# Patient Record
Sex: Female | Born: 1940
Health system: Southern US, Community
[De-identification: ages and names within clinical notes are randomized; demographics above are authoritative.]

## PROBLEM LIST (undated history)

## (undated) DIAGNOSIS — M1990 Unspecified osteoarthritis, unspecified site: Secondary | ICD-10-CM

## (undated) DIAGNOSIS — Z860100 Personal history of colon polyps, unspecified: Secondary | ICD-10-CM

## (undated) DIAGNOSIS — I4819 Other persistent atrial fibrillation: Secondary | ICD-10-CM

## (undated) DIAGNOSIS — I5189 Other ill-defined heart diseases: Secondary | ICD-10-CM

## (undated) DIAGNOSIS — I10 Essential (primary) hypertension: Secondary | ICD-10-CM

## (undated) DIAGNOSIS — K5790 Diverticulosis of intestine, part unspecified, without perforation or abscess without bleeding: Secondary | ICD-10-CM

## (undated) DIAGNOSIS — R079 Chest pain, unspecified: Secondary | ICD-10-CM

## (undated) DIAGNOSIS — K219 Gastro-esophageal reflux disease without esophagitis: Secondary | ICD-10-CM

## (undated) DIAGNOSIS — M8580 Other specified disorders of bone density and structure, unspecified site: Secondary | ICD-10-CM

## (undated) HISTORY — DX: Other ill-defined heart diseases: I51.89

## (undated) HISTORY — PX: HERNIA REPAIR: SHX51

## (undated) HISTORY — PX: CATARACT EXTRACTION: SUR2

## (undated) HISTORY — PX: CHOLECYSTECTOMY: SHX55

## (undated) HISTORY — DX: Other persistent atrial fibrillation: I48.19

## (undated) HISTORY — PX: BREAST CYST ASPIRATION: SHX578

## (undated) HISTORY — DX: Chest pain, unspecified: R07.9

---

## 1977-09-18 HISTORY — PX: ABDOMINAL HYSTERECTOMY: SHX81

## 2003-12-23 ENCOUNTER — Other Ambulatory Visit: Payer: Self-pay

## 2004-09-30 ENCOUNTER — Ambulatory Visit: Payer: Self-pay | Admitting: Family Medicine

## 2006-01-18 ENCOUNTER — Ambulatory Visit: Payer: Self-pay | Admitting: Family Medicine

## 2007-04-19 ENCOUNTER — Ambulatory Visit: Payer: Self-pay | Admitting: Family Medicine

## 2008-06-15 ENCOUNTER — Ambulatory Visit: Payer: Self-pay | Admitting: Family Medicine

## 2009-04-07 ENCOUNTER — Inpatient Hospital Stay: Payer: Self-pay | Admitting: Internal Medicine

## 2009-06-16 ENCOUNTER — Ambulatory Visit: Payer: Self-pay | Admitting: Family Medicine

## 2009-06-29 ENCOUNTER — Ambulatory Visit: Payer: Self-pay | Admitting: Family Medicine

## 2010-08-26 ENCOUNTER — Ambulatory Visit: Payer: Self-pay | Admitting: Family Medicine

## 2011-11-07 ENCOUNTER — Ambulatory Visit: Payer: Self-pay | Admitting: Family Medicine

## 2012-12-11 ENCOUNTER — Ambulatory Visit: Payer: Self-pay | Admitting: Family Medicine

## 2013-12-25 ENCOUNTER — Ambulatory Visit: Payer: Self-pay | Admitting: Family Medicine

## 2014-01-02 ENCOUNTER — Ambulatory Visit: Payer: Self-pay | Admitting: Family Medicine

## 2015-07-21 ENCOUNTER — Other Ambulatory Visit: Payer: Self-pay | Admitting: Family Medicine

## 2015-07-21 DIAGNOSIS — Z1231 Encounter for screening mammogram for malignant neoplasm of breast: Secondary | ICD-10-CM

## 2015-08-04 ENCOUNTER — Ambulatory Visit
Admission: RE | Admit: 2015-08-04 | Discharge: 2015-08-04 | Disposition: A | Discharge status: Home / Self Care | Payer: Commercial Managed Care - HMO | Source: Ambulatory Visit | Attending: Family Medicine | Admitting: Family Medicine

## 2015-08-04 ENCOUNTER — Other Ambulatory Visit: Payer: Self-pay | Admitting: Family Medicine

## 2015-08-04 DIAGNOSIS — Z1231 Encounter for screening mammogram for malignant neoplasm of breast: Secondary | ICD-10-CM

## 2015-09-03 ENCOUNTER — Encounter: Payer: Self-pay | Admitting: *Deleted

## 2015-09-06 ENCOUNTER — Encounter: Payer: Self-pay | Admitting: Anesthesiology

## 2015-09-06 ENCOUNTER — Ambulatory Visit: Payer: Commercial Managed Care - HMO | Admitting: Anesthesiology

## 2015-09-06 ENCOUNTER — Ambulatory Visit
Admission: RE | Admit: 2015-09-06 | Discharge: 2015-09-06 | Disposition: A | Discharge status: Home / Self Care | Payer: Commercial Managed Care - HMO | Source: Ambulatory Visit | Attending: Gastroenterology | Admitting: Gastroenterology

## 2015-09-06 ENCOUNTER — Encounter
Admission: RE | Disposition: A | Discharge status: Home / Self Care | Payer: Self-pay | Source: Ambulatory Visit | Attending: Gastroenterology

## 2015-09-06 DIAGNOSIS — Z1211 Encounter for screening for malignant neoplasm of colon: Secondary | ICD-10-CM | POA: Insufficient documentation

## 2015-09-06 DIAGNOSIS — M8580 Other specified disorders of bone density and structure, unspecified site: Secondary | ICD-10-CM | POA: Insufficient documentation

## 2015-09-06 DIAGNOSIS — I10 Essential (primary) hypertension: Secondary | ICD-10-CM | POA: Diagnosis not present

## 2015-09-06 DIAGNOSIS — D124 Benign neoplasm of descending colon: Secondary | ICD-10-CM | POA: Insufficient documentation

## 2015-09-06 DIAGNOSIS — Z79899 Other long term (current) drug therapy: Secondary | ICD-10-CM | POA: Insufficient documentation

## 2015-09-06 DIAGNOSIS — D120 Benign neoplasm of cecum: Secondary | ICD-10-CM | POA: Insufficient documentation

## 2015-09-06 DIAGNOSIS — D122 Benign neoplasm of ascending colon: Secondary | ICD-10-CM | POA: Diagnosis not present

## 2015-09-06 DIAGNOSIS — K5730 Diverticulosis of large intestine without perforation or abscess without bleeding: Secondary | ICD-10-CM | POA: Diagnosis not present

## 2015-09-06 HISTORY — DX: Essential (primary) hypertension: I10

## 2015-09-06 HISTORY — DX: Diverticulosis of intestine, part unspecified, without perforation or abscess without bleeding: K57.90

## 2015-09-06 HISTORY — DX: Other specified disorders of bone density and structure, unspecified site: M85.80

## 2015-09-06 HISTORY — PX: COLONOSCOPY WITH PROPOFOL: SHX5780

## 2015-09-06 SURGERY — COLONOSCOPY WITH PROPOFOL
Anesthesia: General

## 2015-09-06 MED ORDER — SODIUM CHLORIDE 0.9 % IV SOLN: INTRAVENOUS | Status: DC

## 2015-09-06 MED ORDER — SODIUM CHLORIDE 0.9 % IV SOLN
INTRAVENOUS | Status: DC
  Administered 2015-09-06: 1000 mL via INTRAVENOUS

## 2015-09-06 MED ORDER — PROPOFOL 10 MG/ML IV BOLUS
INTRAVENOUS | Status: DC | PRN
  Administered 2015-09-06 (×2): 20 mg via INTRAVENOUS
  Administered 2015-09-06: 50 mg via INTRAVENOUS
  Administered 2015-09-06: 20 mg via INTRAVENOUS

## 2015-09-06 NOTE — Anesthesia Postprocedure Evaluation (Signed)
Anesthesia Post Note  Patient: Nancy Lopez  Procedure(s) Performed: Procedure(s) (LRB): COLONOSCOPY WITH PROPOFOL (N/A)  Patient location during evaluation: Endoscopy Anesthesia Type: General Level of consciousness: awake and alert Pain management: pain level controlled Vital Signs Assessment: post-procedure vital signs reviewed and stable Respiratory status: spontaneous breathing, nonlabored ventilation, respiratory function stable and patient connected to nasal cannula oxygen Cardiovascular status: blood pressure returned to baseline and stable Postop Assessment: no signs of nausea or vomiting Anesthetic complications: no    Last Vitals:  Filed Vitals:   09/06/15 1000 09/06/15 1010  BP: 122/59 136/64  Pulse: 78 80  Temp:    Resp: 18 19    Last Pain: There were no vitals filed for this visit.               Precious Haws Piscitello

## 2015-09-06 NOTE — Op Note (Signed)
[facility] Patient Name: Nancy Lopez Procedure Date: 09/06/2015 9:10 AM MRN: [id number] Account #: [id number] Date of Birth: 1941/05/12 Admit Type: Outpatient Age: 74 Room: [facility] ENDO ROOM 4 Gender: Female Note Status: Finalized Procedure:         Colonoscopy Indications:       Personal history of colonic polyps Providers:         Lupita Dawn. Candace Cruise, MD Referring MD:      Irven Easterly. Kary Kos, MD (Referring MD) Medicines:         Monitored Anesthesia Care Complications:     No immediate complications. Procedure:         Pre-Anesthesia Assessment:                    - Prior to the procedure, a History and Physical was                     performed, and patient medications, allergies and                     sensitivities were reviewed. The patient's tolerance of                     previous anesthesia was reviewed.                    - The risks and benefits of the procedure and the sedation                     options and risks were discussed with the patient. All                     questions were answered and informed consent was obtained.                    - After reviewing the risks and benefits, the patient was                     deemed in satisfactory condition to undergo the procedure.                    After obtaining informed consent, the colonoscope was                     passed under direct vision. Throughout the procedure, the                     patient's blood pressure, pulse, and oxygen saturations                     were monitored continuously. The Colonoscope was                     introduced through the anus and advanced to the the cecum,                     identified by appendiceal orifice and ileocecal valve. The                     colonoscopy was performed without difficulty. The patient                     tolerated the procedure well. The quality of the bowel  preparation was fair. Findings:  Scattered small and large-mouthed diverticula were found in the entire       colon.      Four sessile polyps were found in the descending colon, in the ascending       colon and in the cecum. The polyps were small in size. These polyps were       removed with a cold snare. Resection and retrieval were complete.      The exam was otherwise without abnormality. Impression:        - Diverticulosis in the entire examined colon.                    - Four small polyps in the descending colon, in the                     ascending colon and in the cecum. Resected and retrieved.                    - The examination was otherwise normal. Recommendation:    - Discharge patient to home.                    - Discharge patient to home.                    - Await pathology results.                    - Repeat colonoscopy in 3 years for surveillance based on                     pathology results.                    - The findings and recommendations were discussed with the                     patient. Procedure Code(s): --- Professional ---                    [phone], Colonoscopy, flexible; with removal of tumor(s),                     polyp(s), or other lesion(s) by snare technique Diagnosis Code(s): --- Professional ---                    D12.4, Benign neoplasm of descending colon                    D12.2, Benign neoplasm of ascending colon                    D12.0, Benign neoplasm of cecum                    Z86.010, Personal history of colonic polyps                    K57.30, Diverticulosis of large intestine without                     perforation or abscess without bleeding CPT copyright 2014 American [employer] Association. All rights reserved. The codes documented in this report are preliminary and upon coder review may  be revised to meet current compliance requirements. Hulen Luster, MD 09/06/2015 9:38:40 AM This report has been signed electronically. Number of Addenda: 0 Note  Initiated On:  09/06/2015 9:10 AM Scope Withdrawal Time: 0 hours 16 minutes 53 seconds  Total Procedure Duration: 0 hours 20 minutes 42 seconds       [facility]

## 2015-09-06 NOTE — H&P (Signed)
  Date of Initial H&P:08/16/2015 History reviewed, patient examined, no change in status, stable for surgery.

## 2015-09-06 NOTE — Transfer of Care (Signed)
Immediate Anesthesia Transfer of Care Note  Patient: Nancy Lopez  Procedure(s) Performed: Procedure(s): COLONOSCOPY WITH PROPOFOL (N/A)  Patient Location: PACU  Anesthesia Type:General  Level of Consciousness: awake  Airway & Oxygen Therapy: Patient Spontanous Breathing  Post-op Assessment: Report given to RN and Post -op Vital signs reviewed and stable  Post vital signs: Reviewed and stable  Last Vitals:  Filed Vitals:   09/06/15 0821 09/06/15 0940  BP: 120/48 99/51  Pulse: 77 79  Temp: 35.6 C   Resp: 16 15    Complications: No apparent anesthesia complications

## 2015-09-06 NOTE — Anesthesia Preprocedure Evaluation (Signed)
Anesthesia Evaluation  Patient identified by MRN, date of birth, ID band Patient awake    Reviewed: Allergy & Precautions, H&P , NPO status , Patient's Chart, lab work & pertinent test results  History of Anesthesia Complications Negative for: history of anesthetic complications  Airway Mallampati: III  TM Distance: >3 FB Neck ROM: limited    Dental  (+) Poor Dentition, Missing, Upper Dentures   Pulmonary neg pulmonary ROS, neg shortness of breath,    Pulmonary exam normal breath sounds clear to auscultation       Cardiovascular Exercise Tolerance: Good hypertension, (-) angina(-) Past MI and (-) DOE Normal cardiovascular exam Rhythm:regular Rate:Normal     Neuro/Psych negative neurological ROS  negative psych ROS   GI/Hepatic negative GI ROS, Neg liver ROS, neg GERD  ,  Endo/Other  negative endocrine ROS  Renal/GU negative Renal ROS  negative genitourinary   Musculoskeletal   Abdominal   Peds  Hematology negative hematology ROS (+)   Anesthesia Other Findings Past Medical History:   Hypertension                                                 Osteopenia                                                   Diverticulosis                                              History reviewed. No pertinent surgical history.  BMI    Body Mass Index   25.16 kg/m 2      Reproductive/Obstetrics negative OB ROS                             Anesthesia Physical Anesthesia Plan  ASA: III  Anesthesia Plan: General   Post-op Pain Management:    Induction:   Airway Management Planned:   Additional Equipment:   Intra-op Plan:   Post-operative Plan:   Informed Consent: I have reviewed the patients History and Physical, chart, labs and discussed the procedure including the risks, benefits and alternatives for the proposed anesthesia with the patient or authorized representative who has indicated  his/her understanding and acceptance.   Dental Advisory Given  Plan Discussed with: Anesthesiologist, CRNA and Surgeon  Anesthesia Plan Comments:         Anesthesia Quick Evaluation

## 2015-09-07 LAB — SURGICAL PATHOLOGY

## 2015-09-09 ENCOUNTER — Encounter: Payer: Self-pay | Admitting: Gastroenterology

## 2015-11-01 DIAGNOSIS — H25813 Combined forms of age-related cataract, bilateral: Secondary | ICD-10-CM | POA: Diagnosis not present

## 2016-06-14 ENCOUNTER — Other Ambulatory Visit: Payer: Self-pay | Admitting: Adult Health

## 2016-06-14 DIAGNOSIS — M7989 Other specified soft tissue disorders: Secondary | ICD-10-CM | POA: Diagnosis not present

## 2016-06-21 ENCOUNTER — Ambulatory Visit
Admission: RE | Admit: 2016-06-21 | Discharge: 2016-06-21 | Disposition: A | Discharge status: Home / Self Care | Payer: PPO | Source: Ambulatory Visit | Attending: Adult Health | Admitting: Adult Health

## 2016-06-21 DIAGNOSIS — M7989 Other specified soft tissue disorders: Secondary | ICD-10-CM | POA: Diagnosis not present

## 2016-06-29 DIAGNOSIS — D649 Anemia, unspecified: Secondary | ICD-10-CM | POA: Diagnosis not present

## 2016-07-13 DIAGNOSIS — I10 Essential (primary) hypertension: Secondary | ICD-10-CM | POA: Diagnosis not present

## 2016-07-20 DIAGNOSIS — Z0000 Encounter for general adult medical examination without abnormal findings: Secondary | ICD-10-CM | POA: Diagnosis not present

## 2016-08-03 ENCOUNTER — Other Ambulatory Visit: Payer: Self-pay | Admitting: Family Medicine

## 2016-08-03 DIAGNOSIS — Z1231 Encounter for screening mammogram for malignant neoplasm of breast: Secondary | ICD-10-CM

## 2016-08-04 DIAGNOSIS — D481 Neoplasm of uncertain behavior of connective and other soft tissue: Secondary | ICD-10-CM | POA: Diagnosis not present

## 2016-08-04 DIAGNOSIS — M25541 Pain in joints of right hand: Secondary | ICD-10-CM | POA: Diagnosis not present

## 2016-08-07 DIAGNOSIS — M8580 Other specified disorders of bone density and structure, unspecified site: Secondary | ICD-10-CM | POA: Diagnosis not present

## 2016-08-22 ENCOUNTER — Encounter
Admission: RE | Admit: 2016-08-22 | Discharge: 2016-08-22 | Disposition: A | Discharge status: Home / Self Care | Payer: PPO | Source: Ambulatory Visit | Attending: Orthopedic Surgery | Admitting: Orthopedic Surgery

## 2016-08-22 DIAGNOSIS — Z01812 Encounter for preprocedural laboratory examination: Secondary | ICD-10-CM | POA: Diagnosis not present

## 2016-08-22 DIAGNOSIS — Z01810 Encounter for preprocedural cardiovascular examination: Secondary | ICD-10-CM | POA: Insufficient documentation

## 2016-08-22 DIAGNOSIS — I10 Essential (primary) hypertension: Secondary | ICD-10-CM | POA: Diagnosis not present

## 2016-08-22 HISTORY — DX: Gastro-esophageal reflux disease without esophagitis: K21.9

## 2016-08-22 HISTORY — DX: Unspecified osteoarthritis, unspecified site: M19.90

## 2016-08-22 LAB — BASIC METABOLIC PANEL
Anion gap: 8 (ref 5–15)
BUN: 21 mg/dL — ABNORMAL HIGH (ref 6–20)
CALCIUM: 8.8 mg/dL — AB (ref 8.9–10.3)
CHLORIDE: 104 mmol/L (ref 101–111)
CO2: 23 mmol/L (ref 22–32)
CREATININE: 0.87 mg/dL (ref 0.44–1.00)
Glucose, Bld: 109 mg/dL — ABNORMAL HIGH (ref 65–99)
Potassium: 3.6 mmol/L (ref 3.5–5.1)
SODIUM: 135 mmol/L (ref 135–145)

## 2016-08-22 NOTE — Pre-Procedure Instructions (Signed)
Compared today's EKG to EKG done on 12/23/2003 no change.

## 2016-08-22 NOTE — Patient Instructions (Signed)
  Your procedure is scheduled [id number] Dec. 14 , 2017. Report to Same Day Surgery. To find out your arrival time please call [phone] between 1PM - 3PM on Wednesday Dec. 13, 2017.  Remember: Instructions that are not followed completely may result in serious medical risk, up to and including death, or upon the discretion of your surgeon and anesthesiologist your surgery may need to be rescheduled.    _x___ 1. Do not eat food or drink liquids after midnight. No gum chewing or hard candies.     ____ 2. No Alcohol for 24 hours before or after surgery.   ____ 3. Bring all medications with you on the day of surgery if instructed.    __x__ 4. Notify your doctor if there is any change in your medical condition     (cold, fever, infections).    _____ 5. No smoking 24 hours prior to surgery.     Do not wear jewelry, make-up, hairpins, clips or nail polish.  Do not wear lotions, powders, or perfumes.   Do not shave 48 hours prior to surgery. Men may shave face and neck.  Do not bring valuables to the hospital.    [facility] is not responsible for any belongings or valuables.               Contacts, dentures or bridgework may not be worn into surgery.  Leave your suitcase in the car. After surgery it may be brought to your room.  For patients admitted to the hospital, discharge time is determined by your treatment team.   Patients discharged the day of surgery will not be allowed to drive home.    Please read over the following fact sheets that you were given:   [facility] Preparing for Surgery  __x__ Take these medicines the morning of surgery with A SIP OF WATER:    1. amLODipine (NORVASC)  2. ranitidine (ZANTAC)     ____ Fleet Enema (as directed)   _x___ Use CHG Soap as directed on instruction sheet  ____ Use inhalers on the day of surgery and bring to hospital day of surgery  ____ Stop metformin 2 days prior to surgery    ____ Take 1/2 of usual insulin dose the night  before surgery and none on the morning of surgery.   ____ Stop Coumadin/Plavix/aspirin on does not apply.  __x__ Stop Anti-inflammatories such as Advil, Aleve, Ibuprofen, Motrin, Naproxen, Naprosyn, Goodies powders or aspirin products. OK to take Tylenol.   __x__ Stop supplements:Cyanocobalamin (VITAMIN 0000000), folic acid (FOLVITE) & Lysine  until after surgery.    ____ Bring C-Pap to the hospital.

## 2016-08-31 ENCOUNTER — Ambulatory Visit: Payer: PPO | Admitting: Anesthesiology

## 2016-08-31 ENCOUNTER — Ambulatory Visit
Admission: RE | Admit: 2016-08-31 | Discharge: 2016-08-31 | Disposition: A | Discharge status: Home / Self Care | Payer: PPO | Source: Ambulatory Visit | Attending: Orthopedic Surgery | Admitting: Orthopedic Surgery

## 2016-08-31 ENCOUNTER — Encounter
Admission: RE | Disposition: A | Discharge status: Home / Self Care | Payer: Self-pay | Source: Ambulatory Visit | Attending: Orthopedic Surgery

## 2016-08-31 ENCOUNTER — Encounter: Payer: Self-pay | Admitting: *Deleted

## 2016-08-31 DIAGNOSIS — D481 Neoplasm of uncertain behavior of connective and other soft tissue: Secondary | ICD-10-CM | POA: Diagnosis not present

## 2016-08-31 DIAGNOSIS — R011 Cardiac murmur, unspecified: Secondary | ICD-10-CM | POA: Diagnosis not present

## 2016-08-31 DIAGNOSIS — K219 Gastro-esophageal reflux disease without esophagitis: Secondary | ICD-10-CM | POA: Diagnosis not present

## 2016-08-31 DIAGNOSIS — D487 Neoplasm of uncertain behavior of other specified sites: Secondary | ICD-10-CM | POA: Diagnosis not present

## 2016-08-31 DIAGNOSIS — Z8719 Personal history of other diseases of the digestive system: Secondary | ICD-10-CM | POA: Insufficient documentation

## 2016-08-31 DIAGNOSIS — Z86010 Personal history of colonic polyps: Secondary | ICD-10-CM | POA: Diagnosis not present

## 2016-08-31 DIAGNOSIS — Z90710 Acquired absence of both cervix and uterus: Secondary | ICD-10-CM | POA: Diagnosis not present

## 2016-08-31 DIAGNOSIS — I10 Essential (primary) hypertension: Secondary | ICD-10-CM | POA: Diagnosis not present

## 2016-08-31 DIAGNOSIS — M8580 Other specified disorders of bone density and structure, unspecified site: Secondary | ICD-10-CM | POA: Insufficient documentation

## 2016-08-31 DIAGNOSIS — M25541 Pain in joints of right hand: Secondary | ICD-10-CM | POA: Diagnosis not present

## 2016-08-31 DIAGNOSIS — D2111 Benign neoplasm of connective and other soft tissue of right upper limb, including shoulder: Secondary | ICD-10-CM | POA: Diagnosis not present

## 2016-08-31 DIAGNOSIS — D480 Neoplasm of uncertain behavior of bone and articular cartilage: Secondary | ICD-10-CM | POA: Diagnosis not present

## 2016-08-31 HISTORY — PX: EXCISION METACARPAL MASS: SHX6372

## 2016-08-31 SURGERY — EXCISION METACARPAL MASS
Anesthesia: General | Site: Hand | Laterality: Right | Wound class: Clean

## 2016-08-31 MED ORDER — METOCLOPRAMIDE HCL 10 MG PO TABS: 5.0000 mg | ORAL_TABLET | Freq: Three times a day (TID) | ORAL | Status: DC | PRN

## 2016-08-31 MED ORDER — ONDANSETRON HCL 4 MG/2ML IJ SOLN
INTRAMUSCULAR | Status: DC | PRN
Start: 2016-08-31 — End: 2016-08-31
  Administered 2016-08-31: 4 mg via INTRAVENOUS

## 2016-08-31 MED ORDER — CEFAZOLIN SODIUM-DEXTROSE 2-4 GM/100ML-% IV SOLN
2.0000 g | Freq: Once | INTRAVENOUS | Status: AC
  Administered 2016-08-31: 2 g via INTRAVENOUS

## 2016-08-31 MED ORDER — SODIUM CHLORIDE 0.9 % IV SOLN: INTRAVENOUS | Status: DC

## 2016-08-31 MED ORDER — PHENYLEPHRINE HCL 10 MG/ML IJ SOLN
INTRAMUSCULAR | Status: DC | PRN
  Administered 2016-08-31 (×3): 100 ug via INTRAVENOUS

## 2016-08-31 MED ORDER — NEOMYCIN-POLYMYXIN B GU 40-200000 IR SOLN
Status: DC | PRN
  Administered 2016-08-31: 2 mL

## 2016-08-31 MED ORDER — NEOMYCIN-POLYMYXIN B GU 40-200000 IR SOLN
Status: AC
  Filled 2016-08-31: qty 2

## 2016-08-31 MED ORDER — ONDANSETRON HCL 4 MG/2ML IJ SOLN: 4.0000 mg | Freq: Once | INTRAMUSCULAR | Status: DC | PRN

## 2016-08-31 MED ORDER — METOCLOPRAMIDE HCL 5 MG/ML IJ SOLN: 5.0000 mg | Freq: Three times a day (TID) | INTRAMUSCULAR | Status: DC | PRN

## 2016-08-31 MED ORDER — HYDROCODONE-ACETAMINOPHEN 5-325 MG PO TABS: 1.0000 | ORAL_TABLET | ORAL | Status: DC | PRN

## 2016-08-31 MED ORDER — CEFAZOLIN SODIUM-DEXTROSE 2-4 GM/100ML-% IV SOLN
INTRAVENOUS | Status: AC
  Filled 2016-08-31: qty 100

## 2016-08-31 MED ORDER — ONDANSETRON HCL 4 MG PO TABS: 4.0000 mg | ORAL_TABLET | Freq: Four times a day (QID) | ORAL | Status: DC | PRN

## 2016-08-31 MED ORDER — ONDANSETRON HCL 4 MG/2ML IJ SOLN: 4.0000 mg | Freq: Four times a day (QID) | INTRAMUSCULAR | Status: DC | PRN

## 2016-08-31 MED ORDER — FENTANYL CITRATE (PF) 100 MCG/2ML IJ SOLN: 25.0000 ug | INTRAMUSCULAR | Status: DC | PRN

## 2016-08-31 MED ORDER — HYDROCODONE-ACETAMINOPHEN 5-325 MG PO TABS: 1.0000 | ORAL_TABLET | Freq: Four times a day (QID) | ORAL | 0 refills | Status: DC | PRN

## 2016-08-31 MED ORDER — LACTATED RINGERS IV SOLN
INTRAVENOUS | Status: DC
  Administered 2016-08-31: 50 mL/h via INTRAVENOUS
  Administered 2016-08-31: 10:00:00 via INTRAVENOUS

## 2016-08-31 MED ORDER — LIDOCAINE HCL (CARDIAC) 20 MG/ML IV SOLN
INTRAVENOUS | Status: DC | PRN
  Administered 2016-08-31: 60 mg via INTRAVENOUS

## 2016-08-31 MED ORDER — DEXAMETHASONE SODIUM PHOSPHATE 10 MG/ML IJ SOLN
INTRAMUSCULAR | Status: DC | PRN
  Administered 2016-08-31: 4 mg via INTRAVENOUS

## 2016-08-31 MED ORDER — BUPIVACAINE HCL (PF) 0.5 % IJ SOLN
INTRAMUSCULAR | Status: AC
  Filled 2016-08-31: qty 30

## 2016-08-31 MED ORDER — FENTANYL CITRATE (PF) 100 MCG/2ML IJ SOLN
INTRAMUSCULAR | Status: DC | PRN
  Administered 2016-08-31 (×2): 50 ug via INTRAVENOUS

## 2016-08-31 MED ORDER — BUPIVACAINE HCL (PF) 0.5 % IJ SOLN
INTRAMUSCULAR | Status: DC | PRN
  Administered 2016-08-31 (×2): 10 mL

## 2016-08-31 MED ORDER — PROPOFOL 10 MG/ML IV BOLUS
INTRAVENOUS | Status: DC | PRN
  Administered 2016-08-31: 100 mg via INTRAVENOUS

## 2016-08-31 SURGICAL SUPPLY — 21 items
CANISTER SUCT 1200ML W/VALVE (MISCELLANEOUS) ×3 IMPLANT
CUFF TOURN 18 STER (MISCELLANEOUS) ×3 IMPLANT
ELECT REM PT RETURN 9FT ADLT (ELECTROSURGICAL) ×3
ELECTRODE REM PT RTRN 9FT ADLT (ELECTROSURGICAL) ×1 IMPLANT
GAUZE PETRO XEROFOAM 1X8 (MISCELLANEOUS) ×3 IMPLANT
GAUZE SPONGE 4X4 12PLY STRL (GAUZE/BANDAGES/DRESSINGS) ×3 IMPLANT
GAUZE STRETCH 2X75IN STRL (MISCELLANEOUS) ×3 IMPLANT
GLOVE SURG SYN 9.0  PF PI (GLOVE) ×2
GLOVE SURG SYN 9.0 PF PI (GLOVE) ×1 IMPLANT
GOWN SRG 2XL LVL 4 RGLN SLV (GOWNS) ×1 IMPLANT
GOWN STRL NON-REIN 2XL LVL4 (GOWNS) ×2
GOWN STRL REUS W/ TWL LRG LVL3 (GOWN DISPOSABLE) ×1 IMPLANT
GOWN STRL REUS W/TWL LRG LVL3 (GOWN DISPOSABLE) ×2
KIT RM TURNOVER STRD PROC AR (KITS) ×3 IMPLANT
NEEDLE HYPO 25X1 1.5 SAFETY (NEEDLE) ×3 IMPLANT
NS IRRIG 500ML POUR BTL (IV SOLUTION) ×3 IMPLANT
PACK EXTREMITY ARMC (MISCELLANEOUS) ×3 IMPLANT
STOCKINETTE STRL 6IN 960660 (GAUZE/BANDAGES/DRESSINGS) ×3 IMPLANT
SUT ETHILON 4-0 (SUTURE) ×2
SUT ETHILON 4-0 FS2 18XMFL BLK (SUTURE) ×1
SUTURE ETHLN 4-0 FS2 18XMF BLK (SUTURE) ×1 IMPLANT

## 2016-08-31 NOTE — Anesthesia Procedure Notes (Signed)
Procedure Name: LMA Insertion Date/Time: 08/31/2016 10:27 AM Performed by: [employer] [facility] Pre-anesthesia Checklist: Patient identified, Emergency Drugs available, Suction available, Patient being monitored and Timeout performed Patient Re-evaluated:Patient Re-evaluated prior to inductionOxygen Delivery Method: Circle system utilized Preoxygenation: Pre-oxygenation with 100% oxygen Intubation Type: IV induction Ventilation: Mask ventilation without difficulty LMA: LMA flexible inserted LMA Size: 3.5 Dental Injury: Teeth and Oropharynx as per pre-operative assessment

## 2016-08-31 NOTE — Op Note (Signed)
08/31/2016  11:23 AM  PATIENT:  Nancy Lopez  75 y.o. female  PRE-OPERATIVE DIAGNOSIS:  giant cell tumor right hand  POST-OPERATIVE DIAGNOSIS:  giant cell tumor right hand  PROCEDURE:  Procedure(s): EXCISION METACARPAL MASS (Right)  SURGEON: Laurene Footman, MD  ASSISTANTS: None  ANESTHESIA:   general  EBL:  Total I/O In: 500 [I.V.:500] Out: 20 [Blood:20]  BLOOD ADMINISTERED:none  DRAINS: none   LOCAL MEDICATIONS USED:  MARCAINE     SPECIMEN:  Source of Specimen:  Excised  mass  DISPOSITION OF SPECIMEN:  PATHOLOGY  COUNTS:  YES  TOURNIQUET:   24 minutes at 250 mmHg  IMPLANTS: None  DICTATION: .Dragon Dictation patient brought the operating room and after adequate general anesthesia was obtained the right arm was prepped and draped in usual sterile fashion with a tourniquet by the upper arm. Appropriate patient identification and timeout procedure were carried out and tourniquet was raised. Curvilinear incision was made over the MCP joint and a flap raised protecting the neurovascular bundles. The the mass separated well from the surrounding tissue and was removed with use of a rongeur and wrapped around the [place] MCP JOINT. GOING MORE PROXIMAL OF THE IT EXTENDED UNDER THE THENAR MUSCULATURE. THE MASS HAD A BROWN DISCOLORATION AND WAS EASILY DISTRIBUTION ADJACENT TISSUE. TOURNIQUET WAS LET DOWN AND THERE IS NO SIGNIFICANT BLEEDING AND NEUROVASCULAR BUNDLES INTACT. THE TOURNIQUET WAS RAISED FOR WOUND CLOSURE WITH SIMPLE INTERRUPTED 4-0 NYLON SKIN CLOSURE 20 CC OF HALF PERCENT SENSORCAINE WERE INFILTRATED PROXIMALLY TO GET A BLOCK FOR POSTOP ANALGESIA. BULKY DRESSING WAS THEN APPLIED WITH XEROFORM 4 X 4'S FLUFFS WEB ROLL AND ACE WRAP TO APPLY PRESSURE TO THE MASS AND TRY TO CLOSE THE DEAD SPACE.  PLAN OF CARE: Discharge to home after PACU  PATIENT DISPOSITION:  PACU - hemodynamically stable.

## 2016-08-31 NOTE — Transfer of Care (Signed)
Immediate Anesthesia Transfer of Care Note  Patient: Nancy Lopez  Procedure(s) Performed: Procedure(s): EXCISION METACARPAL MASS (Right)  Patient Location: PACU  Anesthesia Type:General  Level of Consciousness: sedated  Airway & Oxygen Therapy: Patient Spontanous Breathing and Patient connected to face mask oxygen  Post-op Assessment: Report given to RN and Post -op Vital signs reviewed and stable  Post vital signs: Reviewed and stable  Last Vitals:  Vitals:   08/31/16 0827  BP: 130/78  Pulse: 86  Resp: 16  Temp: 36.2 C    Last Pain:  Vitals:   08/31/16 0827  TempSrc: Tympanic  PainSc: 0-No pain         Complications: No apparent anesthesia complications

## 2016-08-31 NOTE — Anesthesia Preprocedure Evaluation (Signed)
Anesthesia Evaluation  Patient identified by MRN, date of birth, ID band Patient awake    Reviewed: Allergy & Precautions, H&P , NPO status , Patient's Chart, lab work & pertinent test results, reviewed documented beta blocker date and time   History of Anesthesia Complications Negative for: history of anesthetic complications  Airway Mallampati: I  TM Distance: >3 FB Neck ROM: full    Dental  (+) Edentulous Upper, Upper Dentures   Pulmonary neg pulmonary ROS,    Pulmonary exam normal        Cardiovascular Exercise Tolerance: Good hypertension, (-) angina(-) CAD, (-) Past MI, (-) Cardiac Stents and (-) CABG Normal cardiovascular exam(-) dysrhythmias (-) Valvular Problems/Murmurs Rhythm:regular Rate:Normal     Neuro/Psych negative neurological ROS  negative psych ROS   GI/Hepatic Neg liver ROS, GERD  ,  Endo/Other  negative endocrine ROS  Renal/GU negative Renal ROS  negative genitourinary   Musculoskeletal   Abdominal   Peds  Hematology negative hematology ROS (+)   Anesthesia Other Findings Past Medical History: No date: Arthritis     Comment: right knee No date: Diverticulosis No date: GERD (gastroesophageal reflux disease) No date: Hypertension No date: Osteopenia   Reproductive/Obstetrics negative OB ROS                             Anesthesia Physical Anesthesia Plan  ASA: II  Anesthesia Plan: General   Post-op Pain Management:    Induction:   Airway Management Planned:   Additional Equipment:   Intra-op Plan:   Post-operative Plan:   Informed Consent: I have reviewed the patients History and Physical, chart, labs and discussed the procedure including the risks, benefits and alternatives for the proposed anesthesia with the patient or authorized representative who has indicated his/her understanding and acceptance.   Dental Advisory Given  Plan Discussed with:  Anesthesiologist, CRNA and Surgeon  Anesthesia Plan Comments:         Anesthesia Quick Evaluation

## 2016-08-31 NOTE — Discharge Instructions (Addendum)
AMBULATORY SURGERY  DISCHARGE INSTRUCTIONS   1) The drugs that you were given will stay in your system until tomorrow so for the next 24 hours you should not:  A) Drive an automobile B) Make any legal decisions C) Drink any alcoholic beverage   2) You may resume regular meals tomorrow.  Today it is better to start with liquids and gradually work up to solid foods.  You may eat anything you prefer, but it is better to start with liquids, then soup and crackers, and gradually work up to solid foods.   3) Please notify your doctor immediately if you have any unusual bleeding, trouble breathing, redness and pain at the surgery site, drainage, fever, or pain not relieved by medication.    4) Additional Instructions:        Please contact your physician with any problems or Same Day Surgery at [phone], Monday through Friday 6 am to 4 pm, or [place] at [facility] number at [phone].Loosen Ace wrap if fingers swell. Pain medicine as directed. Keep dressing clean and dry of

## 2016-08-31 NOTE — H&P (Signed)
Reviewed paper H+P, will be scanned into chart. No changes noted.  

## 2016-09-01 LAB — SURGICAL PATHOLOGY

## 2016-09-01 NOTE — Anesthesia Postprocedure Evaluation (Signed)
Anesthesia Post Note  Patient: Nancy Lopez  Procedure(s) Performed: Procedure(s) (LRB): EXCISION METACARPAL MASS (Right)  Patient location during evaluation: PACU Anesthesia Type: General Level of consciousness: awake and alert Pain management: pain level controlled Vital Signs Assessment: post-procedure vital signs reviewed and stable Respiratory status: spontaneous breathing, nonlabored ventilation, respiratory function stable and patient connected to nasal cannula oxygen Cardiovascular status: blood pressure returned to baseline and stable Postop Assessment: no signs of nausea or vomiting Anesthetic complications: no    Last Vitals:  Vitals:   08/31/16 1158 08/31/16 1214  BP: (!) 146/68 132/88  Pulse: 77   Resp: 18 18  Temp:      Last Pain:  Vitals:   08/31/16 1158  TempSrc:   PainSc: 0-No pain                 Martha Clan

## 2016-09-04 DIAGNOSIS — D481 Neoplasm of uncertain behavior of connective and other soft tissue: Secondary | ICD-10-CM | POA: Diagnosis not present

## 2016-09-15 ENCOUNTER — Ambulatory Visit
Admission: RE | Admit: 2016-09-15 | Discharge: 2016-09-15 | Disposition: A | Discharge status: Home / Self Care | Payer: PPO | Source: Ambulatory Visit | Attending: Family Medicine | Admitting: Family Medicine

## 2016-09-15 DIAGNOSIS — Z1231 Encounter for screening mammogram for malignant neoplasm of breast: Secondary | ICD-10-CM | POA: Insufficient documentation

## 2016-09-20 ENCOUNTER — Other Ambulatory Visit: Payer: Self-pay | Admitting: Family Medicine

## 2016-09-20 DIAGNOSIS — R928 Other abnormal and inconclusive findings on diagnostic imaging of breast: Secondary | ICD-10-CM

## 2016-09-20 DIAGNOSIS — N6489 Other specified disorders of breast: Secondary | ICD-10-CM

## 2016-09-28 ENCOUNTER — Ambulatory Visit
Admission: RE | Admit: 2016-09-28 | Discharge: 2016-09-28 | Disposition: A | Discharge status: Home / Self Care | Payer: PPO | Source: Ambulatory Visit | Attending: Family Medicine | Admitting: Family Medicine

## 2016-09-28 DIAGNOSIS — Z1231 Encounter for screening mammogram for malignant neoplasm of breast: Secondary | ICD-10-CM | POA: Diagnosis not present

## 2016-09-28 DIAGNOSIS — R928 Other abnormal and inconclusive findings on diagnostic imaging of breast: Secondary | ICD-10-CM

## 2016-09-28 DIAGNOSIS — N6489 Other specified disorders of breast: Secondary | ICD-10-CM

## 2017-02-28 DIAGNOSIS — H25813 Combined forms of age-related cataract, bilateral: Secondary | ICD-10-CM | POA: Diagnosis not present

## 2017-07-20 DIAGNOSIS — Z0000 Encounter for general adult medical examination without abnormal findings: Secondary | ICD-10-CM | POA: Diagnosis not present

## 2017-07-23 DIAGNOSIS — I10 Essential (primary) hypertension: Secondary | ICD-10-CM | POA: Diagnosis not present

## 2017-07-23 DIAGNOSIS — H9190 Unspecified hearing loss, unspecified ear: Secondary | ICD-10-CM | POA: Diagnosis not present

## 2017-07-23 DIAGNOSIS — Z0000 Encounter for general adult medical examination without abnormal findings: Secondary | ICD-10-CM | POA: Diagnosis not present

## 2017-07-25 ENCOUNTER — Other Ambulatory Visit: Payer: Self-pay | Admitting: Family Medicine

## 2017-07-25 DIAGNOSIS — Z1231 Encounter for screening mammogram for malignant neoplasm of breast: Secondary | ICD-10-CM

## 2017-09-17 ENCOUNTER — Ambulatory Visit
Admission: RE | Admit: 2017-09-17 | Discharge: 2017-09-17 | Disposition: A | Discharge status: Home / Self Care | Payer: PPO | Source: Ambulatory Visit | Attending: Family Medicine | Admitting: Family Medicine

## 2017-09-17 DIAGNOSIS — Z1231 Encounter for screening mammogram for malignant neoplasm of breast: Secondary | ICD-10-CM | POA: Diagnosis not present

## 2017-09-24 DIAGNOSIS — H903 Sensorineural hearing loss, bilateral: Secondary | ICD-10-CM | POA: Diagnosis not present

## 2017-12-25 DIAGNOSIS — X32XXXA Exposure to sunlight, initial encounter: Secondary | ICD-10-CM | POA: Diagnosis not present

## 2017-12-25 DIAGNOSIS — L570 Actinic keratosis: Secondary | ICD-10-CM | POA: Diagnosis not present

## 2017-12-25 DIAGNOSIS — L814 Other melanin hyperpigmentation: Secondary | ICD-10-CM | POA: Diagnosis not present

## 2017-12-25 DIAGNOSIS — L821 Other seborrheic keratosis: Secondary | ICD-10-CM | POA: Diagnosis not present

## 2018-06-12 DIAGNOSIS — J40 Bronchitis, not specified as acute or chronic: Secondary | ICD-10-CM | POA: Diagnosis not present

## 2018-08-30 DIAGNOSIS — I499 Cardiac arrhythmia, unspecified: Secondary | ICD-10-CM | POA: Diagnosis not present

## 2018-08-30 DIAGNOSIS — J069 Acute upper respiratory infection, unspecified: Secondary | ICD-10-CM | POA: Diagnosis not present

## 2018-08-30 DIAGNOSIS — R05 Cough: Secondary | ICD-10-CM | POA: Diagnosis not present

## 2018-09-05 DIAGNOSIS — I10 Essential (primary) hypertension: Secondary | ICD-10-CM | POA: Diagnosis not present

## 2018-09-05 DIAGNOSIS — R05 Cough: Secondary | ICD-10-CM | POA: Diagnosis not present

## 2018-09-05 DIAGNOSIS — I499 Cardiac arrhythmia, unspecified: Secondary | ICD-10-CM | POA: Diagnosis not present

## 2018-09-05 DIAGNOSIS — R0681 Apnea, not elsewhere classified: Secondary | ICD-10-CM | POA: Diagnosis not present

## 2018-12-19 DIAGNOSIS — Z0000 Encounter for general adult medical examination without abnormal findings: Secondary | ICD-10-CM | POA: Diagnosis not present

## 2018-12-19 DIAGNOSIS — I10 Essential (primary) hypertension: Secondary | ICD-10-CM | POA: Diagnosis not present

## 2019-06-16 DIAGNOSIS — I10 Essential (primary) hypertension: Secondary | ICD-10-CM | POA: Diagnosis not present

## 2019-06-23 DIAGNOSIS — D649 Anemia, unspecified: Secondary | ICD-10-CM | POA: Diagnosis not present

## 2019-06-23 DIAGNOSIS — R0681 Apnea, not elsewhere classified: Secondary | ICD-10-CM | POA: Diagnosis not present

## 2019-06-23 DIAGNOSIS — Z1211 Encounter for screening for malignant neoplasm of colon: Secondary | ICD-10-CM | POA: Diagnosis not present

## 2019-06-23 DIAGNOSIS — I10 Essential (primary) hypertension: Secondary | ICD-10-CM | POA: Diagnosis not present

## 2019-06-26 ENCOUNTER — Other Ambulatory Visit: Payer: Self-pay | Admitting: Family Medicine

## 2019-06-26 DIAGNOSIS — Z1231 Encounter for screening mammogram for malignant neoplasm of breast: Secondary | ICD-10-CM

## 2019-07-03 DIAGNOSIS — D649 Anemia, unspecified: Secondary | ICD-10-CM | POA: Diagnosis not present

## 2019-07-11 ENCOUNTER — Ambulatory Visit
Admission: RE | Admit: 2019-07-11 | Discharge: 2019-07-11 | Disposition: A | Discharge status: Home / Self Care | Payer: PPO | Source: Ambulatory Visit | Attending: Family Medicine | Admitting: Family Medicine

## 2019-07-11 DIAGNOSIS — Z1231 Encounter for screening mammogram for malignant neoplasm of breast: Secondary | ICD-10-CM | POA: Diagnosis not present

## 2019-07-16 DIAGNOSIS — Z86010 Personal history of colonic polyps: Secondary | ICD-10-CM | POA: Diagnosis not present

## 2019-08-21 DIAGNOSIS — H25813 Combined forms of age-related cataract, bilateral: Secondary | ICD-10-CM | POA: Diagnosis not present

## 2019-11-06 ENCOUNTER — Other Ambulatory Visit
Admission: RE | Admit: 2019-11-06 | Discharge: 2019-11-06 | Disposition: A | Discharge status: Home / Self Care | Payer: PPO | Source: Ambulatory Visit | Attending: Internal Medicine | Admitting: Internal Medicine

## 2019-11-06 DIAGNOSIS — Z20822 Contact with and (suspected) exposure to covid-19: Secondary | ICD-10-CM | POA: Insufficient documentation

## 2019-11-06 DIAGNOSIS — Z01812 Encounter for preprocedural laboratory examination: Secondary | ICD-10-CM | POA: Insufficient documentation

## 2019-11-06 LAB — SARS CORONAVIRUS 2 (TAT 6-24 HRS): SARS Coronavirus 2: NEGATIVE

## 2019-11-07 ENCOUNTER — Encounter: Payer: Self-pay | Admitting: Internal Medicine

## 2019-11-10 ENCOUNTER — Encounter: Payer: Self-pay | Admitting: Internal Medicine

## 2019-11-10 ENCOUNTER — Other Ambulatory Visit: Payer: Self-pay

## 2019-11-10 ENCOUNTER — Ambulatory Visit
Admission: RE | Admit: 2019-11-10 | Discharge: 2019-11-10 | Disposition: A | Discharge status: Home / Self Care | Payer: PPO | Attending: Internal Medicine | Admitting: Internal Medicine

## 2019-11-10 ENCOUNTER — Ambulatory Visit: Payer: PPO | Admitting: Anesthesiology

## 2019-11-10 ENCOUNTER — Encounter
Admission: RE | Disposition: A | Discharge status: Home / Self Care | Payer: Self-pay | Source: Home / Self Care | Attending: Internal Medicine

## 2019-11-10 DIAGNOSIS — Z7983 Long term (current) use of bisphosphonates: Secondary | ICD-10-CM | POA: Diagnosis not present

## 2019-11-10 DIAGNOSIS — Z79899 Other long term (current) drug therapy: Secondary | ICD-10-CM | POA: Insufficient documentation

## 2019-11-10 DIAGNOSIS — K635 Polyp of colon: Secondary | ICD-10-CM | POA: Diagnosis not present

## 2019-11-10 DIAGNOSIS — K5790 Diverticulosis of intestine, part unspecified, without perforation or abscess without bleeding: Secondary | ICD-10-CM | POA: Diagnosis not present

## 2019-11-10 DIAGNOSIS — K640 First degree hemorrhoids: Secondary | ICD-10-CM | POA: Diagnosis not present

## 2019-11-10 DIAGNOSIS — K5730 Diverticulosis of large intestine without perforation or abscess without bleeding: Secondary | ICD-10-CM | POA: Insufficient documentation

## 2019-11-10 DIAGNOSIS — I10 Essential (primary) hypertension: Secondary | ICD-10-CM | POA: Insufficient documentation

## 2019-11-10 DIAGNOSIS — Z86010 Personal history of colonic polyps: Secondary | ICD-10-CM | POA: Insufficient documentation

## 2019-11-10 DIAGNOSIS — D120 Benign neoplasm of cecum: Secondary | ICD-10-CM | POA: Diagnosis not present

## 2019-11-10 DIAGNOSIS — K648 Other hemorrhoids: Secondary | ICD-10-CM | POA: Diagnosis not present

## 2019-11-10 DIAGNOSIS — Z1211 Encounter for screening for malignant neoplasm of colon: Secondary | ICD-10-CM | POA: Diagnosis not present

## 2019-11-10 DIAGNOSIS — K219 Gastro-esophageal reflux disease without esophagitis: Secondary | ICD-10-CM | POA: Diagnosis not present

## 2019-11-10 HISTORY — DX: Personal history of colonic polyps: Z86.010

## 2019-11-10 HISTORY — PX: COLONOSCOPY WITH PROPOFOL: SHX5780

## 2019-11-10 HISTORY — DX: Personal history of colon polyps, unspecified: Z86.0100

## 2019-11-10 SURGERY — COLONOSCOPY WITH PROPOFOL
Anesthesia: General

## 2019-11-10 MED ORDER — PROPOFOL 500 MG/50ML IV EMUL
INTRAVENOUS | Status: DC | PRN
  Administered 2019-11-10: 150 ug/kg/min via INTRAVENOUS

## 2019-11-10 MED ORDER — PROPOFOL 10 MG/ML IV BOLUS
INTRAVENOUS | Status: DC | PRN
  Administered 2019-11-10: 40 mg via INTRAVENOUS

## 2019-11-10 MED ORDER — SODIUM CHLORIDE 0.9 % IV SOLN: INTRAVENOUS | Status: DC

## 2019-11-10 NOTE — Transfer of Care (Signed)
Immediate Anesthesia Transfer of Care Note  Patient: Nancy Lopez  Procedure(s) Performed: COLONOSCOPY WITH PROPOFOL (N/A )  Patient Location: PACU  Anesthesia Type:General  Level of Consciousness: sedated  Airway & Oxygen Therapy: Patient Spontanous Breathing and Patient connected to nasal cannula oxygen  Post-op Assessment: Report given to RN and Post -op Vital signs reviewed and stable  Post vital signs: Reviewed and stable  Last Vitals:  Vitals Value Taken Time  BP 121/59 11/10/19 1124  Temp    Pulse 62 11/10/19 1124  Resp 15 11/10/19 1124  SpO2 100 % 11/10/19 1124  Vitals shown include unvalidated device data.  Last Pain:  Vitals:   11/10/19 1003  TempSrc: Temporal  PainSc: 0-No pain         Complications: No apparent anesthesia complications

## 2019-11-10 NOTE — Anesthesia Preprocedure Evaluation (Signed)
Anesthesia Evaluation  Patient identified by MRN, date of birth, ID band Patient awake    Reviewed: Allergy & Precautions, H&P , NPO status , Patient's Chart, lab work & pertinent test results, reviewed documented beta blocker date and time   History of Anesthesia Complications Negative for: history of anesthetic complications  Airway Mallampati: I  TM Distance: >3 FB Neck ROM: full    Dental  (+) Edentulous Upper, Upper Dentures   Pulmonary neg pulmonary ROS,    Pulmonary exam normal        Cardiovascular Exercise Tolerance: Good hypertension, (-) angina(-) CAD, (-) Past MI, (-) Cardiac Stents and (-) CABG Normal cardiovascular exam(-) dysrhythmias (-) Valvular Problems/Murmurs     Neuro/Psych negative neurological ROS  negative psych ROS   GI/Hepatic Neg liver ROS, GERD  ,  Endo/Other  negative endocrine ROS  Renal/GU negative Renal ROS  negative genitourinary   Musculoskeletal   Abdominal   Peds  Hematology negative hematology ROS (+)   Anesthesia Other Findings Past Medical History: No date: Arthritis     Comment: right knee No date: Diverticulosis No date: GERD (gastroesophageal reflux disease) No date: Hypertension No date: Osteopenia   Reproductive/Obstetrics negative OB ROS                             Anesthesia Physical  Anesthesia Plan  ASA: II  Anesthesia Plan: General   Post-op Pain Management:    Induction: Intravenous  PONV Risk Score and Plan: 3 and Propofol infusion and TIVA  Airway Management Planned: Natural Airway and Nasal Cannula  Additional Equipment:   Intra-op Plan:   Post-operative Plan:   Informed Consent: I have reviewed the patients History and Physical, chart, labs and discussed the procedure including the risks, benefits and alternatives for the proposed anesthesia with the patient or authorized representative who has indicated his/her  understanding and acceptance.     Dental Advisory Given  Plan Discussed with: Anesthesiologist, CRNA and Surgeon  Anesthesia Plan Comments:         Anesthesia Quick Evaluation

## 2019-11-10 NOTE — Op Note (Signed)
[facility]enter[facility] Patient Name: Nancy Lopez Procedure Date: 11/10/2019 10:50 AM MRN: [id number] Account #: [id number] Date of Birth: 01-Feb-1941 Admit Type: Outpatient Age: 79 Room: [facility] ENDO ROOM 3 Gender: Female Note Status: Finalized Procedure:             Colonoscopy Indications:           Surveillance: Personal history of adenomatous polyps                         on last colonoscopy > 3 years ago, High risk colon                         cancer surveillance: Personal history of multiple (3                         or more) adenomas Providers:             Keath Matera K. Karlissa Aron MD, MD Medicines:             Propofol per Anesthesia Complications:         No immediate complications. Procedure:             Pre-Anesthesia Assessment:                        - The risks and benefits of the procedure and the                         sedation options and risks were discussed with the                         patient. All questions were answered and informed                         consent was obtained.                        - Patient identification and proposed procedure were                         verified prior to the procedure by the nurse. The                         procedure was verified in the procedure room.                        - ASA Grade Assessment: III - A patient with severe                         systemic disease.                        - After reviewing the risks and benefits, the patient                         was deemed in satisfactory condition to undergo the                         procedure.  After obtaining informed consent, the colonoscope was                         passed under direct vision. Throughout the procedure,                         the patient's blood pressure, pulse, and oxygen                         saturations were monitored continuously. The                         Colonoscope was introduced  through the anus and                         advanced to the the cecum, identified by appendiceal                         orifice and ileocecal valve. The colonoscopy was                         performed without difficulty. The patient tolerated                         the procedure well. The quality of the bowel                         preparation was good. The ileocecal valve, appendiceal                         orifice, and rectum were photographed. Findings:      The perianal and digital rectal examinations were normal. Pertinent       negatives include normal sphincter tone and no palpable rectal lesions.      Multiple small and large-mouthed diverticula were found in the entire       colon.      A 3 mm polyp was found in the cecum. The polyp was sessile. The polyp       was removed with a jumbo cold forceps. Resection and retrieval were       complete.      Non-bleeding internal hemorrhoids were found during retroflexion. The       hemorrhoids were Grade I (internal hemorrhoids that do not prolapse).      The exam was otherwise without abnormality. Impression:            - Diverticulosis in the entire examined colon.                        - One 3 mm polyp in the cecum, removed with a jumbo                         cold forceps. Resected and retrieved.                        - Non-bleeding internal hemorrhoids.                        - The examination was otherwise normal. Recommendation:        -  Patient has a contact number available for                         emergencies. The signs and symptoms of potential                         delayed complications were discussed with the patient.                         Return to normal activities tomorrow. Written                         discharge instructions were provided to the patient.                        - Resume previous diet.                        - Continue present medications.                        - No repeat colonoscopy  due to current age (5 years                         or older).                        - Return to GI office PRN.                        - The findings and recommendations were discussed with                         the patient. Procedure Code(s):     --- Professional ---                        [phone], Colonoscopy, flexible; with biopsy, single or                         multiple Diagnosis Code(s):     --- Professional ---                        K57.30, Diverticulosis of large intestine without                         perforation or abscess without bleeding                        Z86.010, Personal history of colonic polyps                        K63.5, Polyp of colon                        K64.0, First degree hemorrhoids CPT copyright 2019 American Medical Association. All rights reserved. The codes documented in this report are preliminary and upon coder review may  be revised to meet current compliance requirements. Efrain Sella MD, MD 11/10/2019 11:22:56 AM This report has been signed electronically. Number of Addenda: 0 Note Initiated On: 11/10/2019 10:50 AM Scope Withdrawal  Time: 0 hours 6 minutes 31 seconds  Total Procedure Duration: 0 hours 10 minutes 3 seconds  Estimated Blood Loss:  Estimated blood loss: none.      [facility]

## 2019-11-10 NOTE — Anesthesia Postprocedure Evaluation (Signed)
Anesthesia Post Note  Patient: Nancy Lopez  Procedure(s) Performed: COLONOSCOPY WITH PROPOFOL (N/A )  Patient location during evaluation: Endoscopy Anesthesia Type: General Level of consciousness: awake and alert Pain management: pain level controlled Vital Signs Assessment: post-procedure vital signs reviewed and stable Respiratory status: spontaneous breathing, nonlabored ventilation, respiratory function stable and patient connected to nasal cannula oxygen Cardiovascular status: blood pressure returned to baseline and stable Postop Assessment: no apparent nausea or vomiting Anesthetic complications: no     Last Vitals:  Vitals:   11/10/19 1136 11/10/19 1146  BP: (!) 141/72 (!) 167/71  Pulse: 62 (!) 59  Resp: 13 15  Temp:    SpO2: 100% 100%    Last Pain:  Vitals:   11/10/19 1146  TempSrc:   PainSc: 0-No pain                 Martha Clan

## 2019-11-10 NOTE — H&P (Signed)
Outpatient short stay form Pre-procedure 11/10/2019 10:54 AM Mccoy Testa K. Alice Reichert, M.D.  Primary Physician: [place] Pink, M.D.  Reason for visit:  Personal hx of colon polyps (Tubular adenoma x 4- 2016)  History of present illness:                            Patient presents for colonoscopy for a personal hx of colon polyps. The patient denies abdominal pain, abnormal weight loss or rectal bleeding.       Current Facility-Administered Medications:  .  0.9 %  sodium chloride infusion, , Intravenous, Continuous, Whiting, Benay Pike, MD, Last Rate: 20 mL/hr at 11/10/19 1019, New Bag at 11/10/19 1019  Medications Prior to Admission  Medication Sig Dispense Refill Last Dose  . alendronate (FOSAMAX) 70 MG tablet Take 70 mg by mouth once a week. Take with a full glass of water on an empty stomach.   Past Week at Unknown time  . Calcium Carb-Cholecalciferol (CALCIUM 500/D) 500-400 MG-UNIT CHEW Chew 2 tablets by mouth. In am   Past Week at Unknown time  . cholecalciferol (VITAMIN D) 1000 units tablet Take 1,000 Units by mouth daily. In am.   Past Week at Unknown time  . Cyanocobalamin (VITAMIN B-12) 5000 MCG SUBL Place 5,000 mcg under the tongue. In am.   Past Week at Unknown time  . folic acid (FOLVITE) A999333 MCG tablet Take 400 mcg by mouth daily. In am.   Past Week at Unknown time  . Lysine 500 MG TABS Take 1 tablet by mouth. In am.   Past Week at Unknown time  . ranitidine (ZANTAC) 150 MG tablet Take 150 mg by mouth daily as needed for heartburn.   Past Week at Unknown time  . triamterene-hydrochlorothiazide (MAXZIDE-25) 37.5-25 MG tablet Take 1 tablet by mouth daily. In am.   11/10/2019 at 0630  . amLODipine (NORVASC) 5 MG tablet Take 5 mg by mouth daily. In am.   Not Taking at Unknown time  . HYDROcodone-acetaminophen (NORCO) 5-325 MG tablet Take 1 tablet by mouth every 6 (six) hours as needed for moderate pain. (Patient not taking: Reported on 11/10/2019) 30 tablet 0 Not Taking at Unknown time      No Known Allergies   Past Medical History:  Diagnosis Date  . Arthritis    right knee  . Diverticulosis   . GERD (gastroesophageal reflux disease)   . Hypertension   . Osteopenia   . Personal history of colonic polyps     Review of systems:  Otherwise negative.    Physical Exam  Gen: Alert, oriented. Appears stated age.  HEENT: [place]/AT. PERRLA. Lungs: CTA, no wheezes. CV: RR nl S1, S2. Abd: soft, benign, no masses. BS+ Ext: No edema. Pulses 2+    Planned procedures: Proceed with colonoscopy. The patient understands the nature of the planned procedure, indications, risks, alternatives and potential complications including but not limited to bleeding, infection, perforation, damage to internal organs and possible oversedation/side effects from anesthesia. The patient agrees and gives consent to proceed.  Please refer to procedure notes for findings, recommendations and patient disposition/instructions.     Kemyah Buser K. Alice Reichert, M.D. Gastroenterology 11/10/2019  10:54 AM

## 2019-11-10 NOTE — Interval H&P Note (Signed)
History and Physical Interval Note:  11/10/2019 10:56 AM  Nancy Lopez  has presented today for surgery, with the diagnosis of hx polyps.  The various methods of treatment have been discussed with the patient and family. After consideration of risks, benefits and other options for treatment, the patient has consented to  Procedure(s): COLONOSCOPY WITH PROPOFOL (N/A) as a surgical intervention.  The patient's history has been reviewed, patient examined, no change in status, stable for surgery.  I have reviewed the patient's chart and labs.  Questions were answered to the patient's satisfaction.     [place], [place]

## 2019-11-10 NOTE — Anesthesia Procedure Notes (Signed)
Date/Time: 11/10/2019 11:07 AM Performed by: Nelda Marseille, CRNA Pre-anesthesia Checklist: Patient identified, Emergency Drugs available, Suction available, Patient being monitored and Timeout performed Oxygen Delivery Method: Nasal cannula

## 2019-11-11 ENCOUNTER — Encounter: Payer: Self-pay | Admitting: *Deleted

## 2019-11-11 LAB — SURGICAL PATHOLOGY

## 2019-12-22 DIAGNOSIS — H903 Sensorineural hearing loss, bilateral: Secondary | ICD-10-CM | POA: Diagnosis not present

## 2020-05-03 DIAGNOSIS — I10 Essential (primary) hypertension: Secondary | ICD-10-CM | POA: Diagnosis not present

## 2020-05-13 DIAGNOSIS — R400 Somnolence: Secondary | ICD-10-CM | POA: Diagnosis not present

## 2020-05-13 DIAGNOSIS — Z0000 Encounter for general adult medical examination without abnormal findings: Secondary | ICD-10-CM | POA: Diagnosis not present

## 2020-05-13 DIAGNOSIS — D649 Anemia, unspecified: Secondary | ICD-10-CM | POA: Diagnosis not present

## 2020-05-13 DIAGNOSIS — R0683 Snoring: Secondary | ICD-10-CM | POA: Diagnosis not present

## 2020-05-13 DIAGNOSIS — R0681 Apnea, not elsewhere classified: Secondary | ICD-10-CM | POA: Diagnosis not present

## 2020-05-13 DIAGNOSIS — I10 Essential (primary) hypertension: Secondary | ICD-10-CM | POA: Diagnosis not present

## 2020-05-14 ENCOUNTER — Other Ambulatory Visit: Payer: Self-pay | Admitting: Family Medicine

## 2020-05-14 DIAGNOSIS — Z1231 Encounter for screening mammogram for malignant neoplasm of breast: Secondary | ICD-10-CM

## 2020-07-12 ENCOUNTER — Other Ambulatory Visit: Payer: Self-pay

## 2020-07-12 ENCOUNTER — Ambulatory Visit
Admission: RE | Admit: 2020-07-12 | Discharge: 2020-07-12 | Disposition: A | Discharge status: Home / Self Care | Payer: PPO | Source: Ambulatory Visit | Attending: Family Medicine | Admitting: Family Medicine

## 2020-07-12 DIAGNOSIS — Z1231 Encounter for screening mammogram for malignant neoplasm of breast: Secondary | ICD-10-CM | POA: Insufficient documentation

## 2020-11-15 DIAGNOSIS — R059 Cough, unspecified: Secondary | ICD-10-CM | POA: Diagnosis not present

## 2020-11-15 DIAGNOSIS — D649 Anemia, unspecified: Secondary | ICD-10-CM | POA: Diagnosis not present

## 2020-11-15 DIAGNOSIS — I10 Essential (primary) hypertension: Secondary | ICD-10-CM | POA: Diagnosis not present

## 2020-11-15 DIAGNOSIS — R0681 Apnea, not elsewhere classified: Secondary | ICD-10-CM | POA: Diagnosis not present

## 2021-01-17 ENCOUNTER — Other Ambulatory Visit: Payer: Self-pay

## 2021-01-17 ENCOUNTER — Emergency Department: Payer: PPO

## 2021-01-17 ENCOUNTER — Encounter: Payer: Self-pay | Admitting: *Deleted

## 2021-01-17 ENCOUNTER — Inpatient Hospital Stay
Admission: EM | Admit: 2021-01-17 | Discharge: 2021-01-20 | DRG: 871 | Disposition: A | Discharge status: Home / Self Care | Payer: PPO | Attending: Internal Medicine | Admitting: Internal Medicine

## 2021-01-17 DIAGNOSIS — D508 Other iron deficiency anemias: Secondary | ICD-10-CM | POA: Diagnosis present

## 2021-01-17 DIAGNOSIS — D649 Anemia, unspecified: Secondary | ICD-10-CM | POA: Diagnosis not present

## 2021-01-17 DIAGNOSIS — R0602 Shortness of breath: Secondary | ICD-10-CM | POA: Diagnosis not present

## 2021-01-17 DIAGNOSIS — J189 Pneumonia, unspecified organism: Secondary | ICD-10-CM | POA: Diagnosis not present

## 2021-01-17 DIAGNOSIS — Z20822 Contact with and (suspected) exposure to covid-19: Secondary | ICD-10-CM | POA: Diagnosis present

## 2021-01-17 DIAGNOSIS — Z79899 Other long term (current) drug therapy: Secondary | ICD-10-CM | POA: Diagnosis not present

## 2021-01-17 DIAGNOSIS — M8580 Other specified disorders of bone density and structure, unspecified site: Secondary | ICD-10-CM | POA: Diagnosis not present

## 2021-01-17 DIAGNOSIS — I160 Hypertensive urgency: Secondary | ICD-10-CM | POA: Diagnosis present

## 2021-01-17 DIAGNOSIS — Z7983 Long term (current) use of bisphosphonates: Secondary | ICD-10-CM

## 2021-01-17 DIAGNOSIS — A419 Sepsis, unspecified organism: Secondary | ICD-10-CM | POA: Diagnosis not present

## 2021-01-17 DIAGNOSIS — M25511 Pain in right shoulder: Secondary | ICD-10-CM | POA: Diagnosis present

## 2021-01-17 DIAGNOSIS — D509 Iron deficiency anemia, unspecified: Secondary | ICD-10-CM | POA: Diagnosis not present

## 2021-01-17 DIAGNOSIS — I4891 Unspecified atrial fibrillation: Secondary | ICD-10-CM | POA: Diagnosis not present

## 2021-01-17 DIAGNOSIS — J181 Lobar pneumonia, unspecified organism: Secondary | ICD-10-CM | POA: Diagnosis present

## 2021-01-17 DIAGNOSIS — E876 Hypokalemia: Secondary | ICD-10-CM | POA: Diagnosis not present

## 2021-01-17 DIAGNOSIS — R6520 Severe sepsis without septic shock: Secondary | ICD-10-CM | POA: Diagnosis not present

## 2021-01-17 DIAGNOSIS — I10 Essential (primary) hypertension: Secondary | ICD-10-CM | POA: Diagnosis present

## 2021-01-17 LAB — LACTIC ACID, PLASMA
Lactic Acid, Venous: 0.7 mmol/L (ref 0.5–1.9)
Lactic Acid, Venous: 1 mmol/L (ref 0.5–1.9)

## 2021-01-17 LAB — PROCALCITONIN: Procalcitonin: <0.1 ng/mL

## 2021-01-17 LAB — BASIC METABOLIC PANEL
Anion gap: 9 (ref 5–15)
BUN: 14 mg/dL (ref 8–23)
CO2: 22 mmol/L (ref 22–32)
Calcium: 8.5 mg/dL — ABNORMAL LOW (ref 8.9–10.3)
Chloride: 104 mmol/L (ref 98–111)
Creatinine, Ser: 0.69 mg/dL (ref 0.44–1.00)
GFR, Estimated: >60 mL/min (ref >60)
Glucose, Bld: 109 mg/dL — ABNORMAL HIGH (ref 70–99)
Potassium: 3.3 mmol/L — ABNORMAL LOW (ref 3.5–5.1)
Sodium: 135 mmol/L (ref 135–145)

## 2021-01-17 LAB — CBC
HCT: 33.3 % — ABNORMAL LOW (ref 36.0–46.0)
Hemoglobin: 11.1 g/dL — ABNORMAL LOW (ref 12.0–15.0)
MCH: 29 pg (ref 26.0–34.0)
MCHC: 33.3 g/dL (ref 30.0–36.0)
MCV: 86.9 fL (ref 80.0–100.0)
Platelets: 242 10*3/uL (ref 150–400)
RBC: 3.83 MIL/uL — ABNORMAL LOW (ref 3.87–5.11)
RDW: 13.4 % (ref 11.5–15.5)
WBC: 15.4 10*3/uL — ABNORMAL HIGH (ref 4.0–10.5)
nRBC: 0 % (ref 0.0–0.2)

## 2021-01-17 LAB — TROPONIN I (HIGH SENSITIVITY)
Troponin I (High Sensitivity): 9 ng/L (ref <18)
Troponin I (High Sensitivity): 9 ng/L (ref <18)

## 2021-01-17 LAB — URINALYSIS, COMPLETE (UACMP) WITH MICROSCOPIC
Bilirubin Urine: NEGATIVE
Glucose, UA: NEGATIVE mg/dL
Hgb urine dipstick: NEGATIVE
Ketones, ur: NEGATIVE mg/dL
Leukocytes,Ua: NEGATIVE
Nitrite: NEGATIVE
Protein, ur: NEGATIVE mg/dL
Specific Gravity, Urine: 1.008 (ref 1.005–1.030)
pH: 7 (ref 5.0–8.0)

## 2021-01-17 LAB — TSH: TSH: 2.387 u[IU]/mL (ref 0.350–4.500)

## 2021-01-17 LAB — RESP PANEL BY RT-PCR (FLU A&B, COVID) ARPGX2
Influenza A by PCR: NEGATIVE
Influenza B by PCR: NEGATIVE
SARS Coronavirus 2 by RT PCR: NEGATIVE

## 2021-01-17 LAB — PROTIME-INR
INR: 1.2 (ref 0.8–1.2)
Prothrombin Time: 15.3 seconds — ABNORMAL HIGH (ref 11.4–15.2)

## 2021-01-17 LAB — MAGNESIUM: Magnesium: 2.1 mg/dL (ref 1.7–2.4)

## 2021-01-17 LAB — D-DIMER, QUANTITATIVE: D-Dimer, Quant: 0.54 ug/mL-FEU — ABNORMAL HIGH (ref 0.00–0.50)

## 2021-01-17 LAB — APTT: aPTT: 34 seconds (ref 24–36)

## 2021-01-17 MED ORDER — SODIUM CHLORIDE 0.9 % IV BOLUS
500.0000 mL | Freq: Once | INTRAVENOUS | Status: AC
  Administered 2021-01-17: 500 mL via INTRAVENOUS

## 2021-01-17 MED ORDER — LACTATED RINGERS IV BOLUS
1000.0000 mL | Freq: Once | INTRAVENOUS | Status: AC
  Administered 2021-01-17: 1000 mL via INTRAVENOUS

## 2021-01-17 MED ORDER — SODIUM CHLORIDE 0.9 % IV SOLN
1.0000 g | Freq: Once | INTRAVENOUS | Status: AC
  Administered 2021-01-17: 1 g via INTRAVENOUS
  Filled 2021-01-17: qty 10

## 2021-01-17 MED ORDER — DILTIAZEM LOAD VIA INFUSION
10.0000 mg | Freq: Once | INTRAVENOUS | Status: AC
  Administered 2021-01-17: 10 mg via INTRAVENOUS
  Filled 2021-01-17: qty 10

## 2021-01-17 MED ORDER — HEPARIN BOLUS VIA INFUSION
3200.0000 [IU] | Freq: Once | INTRAVENOUS | Status: AC
  Administered 2021-01-17: 3200 [IU] via INTRAVENOUS
  Filled 2021-01-17: qty 3200

## 2021-01-17 MED ORDER — POTASSIUM CHLORIDE CRYS ER 20 MEQ PO TBCR
40.0000 meq | EXTENDED_RELEASE_TABLET | Freq: Once | ORAL | Status: AC
  Administered 2021-01-17: 40 meq via ORAL
  Filled 2021-01-17: qty 2

## 2021-01-17 MED ORDER — DILTIAZEM HCL-DEXTROSE 125-5 MG/125ML-% IV SOLN (PREMIX)
5.0000 mg/h | INTRAVENOUS | Status: DC
  Administered 2021-01-17: 5 mg/h via INTRAVENOUS
  Filled 2021-01-17: qty 125

## 2021-01-17 MED ORDER — SODIUM CHLORIDE 0.9 % IV SOLN
500.0000 mg | Freq: Once | INTRAVENOUS | Status: AC
  Administered 2021-01-17: 500 mg via INTRAVENOUS
  Filled 2021-01-17: qty 500

## 2021-01-17 MED ORDER — ACETAMINOPHEN 500 MG PO TABS
1000.0000 mg | ORAL_TABLET | Freq: Once | ORAL | Status: AC
  Administered 2021-01-17: 1000 mg via ORAL
  Filled 2021-01-17: qty 2

## 2021-01-17 MED ORDER — HEPARIN (PORCINE) 25000 UT/250ML-% IV SOLN
1050.0000 [IU]/h | INTRAVENOUS | Status: DC
  Administered 2021-01-17: 900 [IU]/h via INTRAVENOUS
  Administered 2021-01-18: 1050 [IU]/h via INTRAVENOUS
  Filled 2021-01-17 (×2): qty 250

## 2021-01-17 NOTE — ED Triage Notes (Signed)
Pt ambulatory to triage.  Pt reports waking up with neck pain this morning.  Pt started having sob this afternoon.  Pt denies chest pain.  Nonsmoker.  No cough.  No fever.  No known injury to neck  Pt alert   Speech clear.

## 2021-01-17 NOTE — Consult Note (Signed)
ANTICOAGULATION CONSULT NOTE - Initial Consult  Pharmacy Consult for heparin infusion Indication: atrial fibrillation  No Known Allergies  Patient Measurements: Height: 5\' 3"  (160 cm) Weight: 64 kg (141 lb) IBW/kg (Calculated) : 52.4 Heparin Dosing Weight: 64 kg   Vital Signs: Temp: 100.3 F (37.9 C) (05/02 1932) Temp Source: Oral (05/02 1932) BP: 125/93 (05/02 2100) Pulse Rate: 118 (05/02 2100)  Labs: Recent Labs    01/17/21 1936  HGB 11.1*  HCT 33.3*  PLT 242  APTT 34  LABPROT 15.3*  INR 1.2  CREATININE 0.69  TROPONINIHS 9    Estimated Creatinine Clearance: 51.3 mL/min (by C-G formula based on SCr of 0.69 mg/dL).   Medical History: Past Medical History:  Diagnosis Date  . Arthritis    right knee  . Diverticulosis   . GERD (gastroesophageal reflux disease)   . Hypertension   . Osteopenia   . Personal history of colonic polyps     Medications:  No prior anticoagulation noted   Assessment: 80 y.o. female  presents for assessment approximately 1 day of some bilateral upper chest pain. ECG with evidence of  New onset A. fib with RVR. Pharmacy has been consulted for initiation and management of heparin infusion.  Goal of Therapy:  Heparin level 0.3-0.7 units/ml Monitor platelets by anticoagulation protocol: Yes   Plan:  Give 3200 units bolus x 1 Start heparin infusion at 900 units/hr Check anti-Xa level in 8 hours and daily while on heparin Continue to monitor H&H and platelets  Dorothe Pea, PharmD, BCPS 01/17/2021,9:48 PM

## 2021-01-17 NOTE — ED Provider Notes (Signed)
[facility] Emergency Department Provider Note  ____________________________________________   Event Date/Time   First MD Initiated Contact with Patient 01/17/21 1944     (approximate)  I have reviewed the triage vital signs and the nursing notes.   HISTORY  Chief Complaint Neck Pain   HPI Nancy Lopez is a 80 y.o. female with a past medical history of arthritis, diverticulosis, GERD, HTN and osteopenia who presents for assessment approximately 1 day of some bilateral upper chest pain.  She describes it as neck pain but points to her upper chest.  She denies any shortness of breath, cough, fevers, headache, earache, sore throat, nausea, vomiting, diarrhea, dysuria, rash or any other associated pain or discomfort.  No recent falls or injuries.  She denies any known cardiac history or any history of A. fib.  No significant EtOH use or illicit drug use.         Past Medical History:  Diagnosis Date  . Arthritis    right knee  . Diverticulosis   . GERD (gastroesophageal reflux disease)   . Hypertension   . Osteopenia   . Personal history of colonic polyps     Patient Active Problem List   Diagnosis Date Noted  . Atrial fibrillation with RVR ([place]) 01/17/2021    Past Surgical History:  Procedure Laterality Date  . ABDOMINAL HYSTERECTOMY  1979  . BREAST CYST ASPIRATION    . CHOLECYSTECTOMY    . COLONOSCOPY WITH PROPOFOL N/A 09/06/2015   Procedure: COLONOSCOPY WITH PROPOFOL;  Surgeon: Hulen Luster, MD;  Location: [facility] ENDOSCOPY;  Service: Gastroenterology;  Laterality: N/A;  . COLONOSCOPY WITH PROPOFOL N/A 11/10/2019   Procedure: COLONOSCOPY WITH PROPOFOL;  Surgeon: Toledo, Benay Pike, MD;  Location: [facility] ENDOSCOPY;  Service: Gastroenterology;  Laterality: N/A;  . EXCISION METACARPAL MASS Right 08/31/2016   Procedure: EXCISION METACARPAL MASS;  Surgeon: Hessie Knows, MD;  Location: [facility] ORS;  Service: Orthopedics;  Laterality: Right;  . HERNIA  REPAIR      Prior to Admission medications   Medication Sig Start Date End Date Taking? Authorizing Provider  alendronate (FOSAMAX) 70 MG tablet Take 70 mg by mouth once a week. Take with a full glass of water on an empty stomach.    [provider]  amLODipine (NORVASC) 5 MG tablet Take 5 mg by mouth daily. In am.    [provider]  Calcium Carb-Cholecalciferol (CALCIUM 500/D) 500-400 MG-UNIT CHEW Chew 2 tablets by mouth. In am    [provider]  cholecalciferol (VITAMIN D) 1000 units tablet Take 1,000 Units by mouth daily. In am.    [provider]  Cyanocobalamin (VITAMIN B-12) 5000 MCG SUBL Place 5,000 mcg under the tongue. In am.    [provider]  folic acid (FOLVITE) 161 MCG tablet Take 400 mcg by mouth daily. In am.    [provider]  HYDROcodone-acetaminophen (NORCO) 5-325 MG tablet Take 1 tablet by mouth every 6 (six) hours as needed for moderate pain. Patient not taking: Reported on 11/10/2019 08/31/16   Hessie Knows, MD  Lysine 500 MG TABS Take 1 tablet by mouth. In am.    [provider]  ranitidine (ZANTAC) 150 MG tablet Take 150 mg by mouth daily as needed for heartburn.    [provider]  triamterene-hydrochlorothiazide (MAXZIDE-25) 37.5-25 MG tablet Take 1 tablet by mouth daily. In am.    [provider]    Allergies Patient has no known allergies.  Family History  Problem Relation Age of Onset  . Breast cancer Neg Hx     Social History Social History   Tobacco Use  . Smoking status: Never Smoker  . Smokeless tobacco: Never Used  Vaping Use  . Vaping Use: Never used  Substance Use Topics  . Alcohol use: No  . Drug use: No    Review of Systems  Review of Systems  Constitutional: Negative for chills and fever.  HENT: Negative for sore throat.   Eyes: Negative for pain.  Respiratory: Negative for cough and stridor.   Cardiovascular: Positive for chest pain.   Gastrointestinal: Negative for vomiting.  Genitourinary: Negative for dysuria.  Musculoskeletal: Negative for myalgias.  Skin: Negative for rash.  Neurological: Negative for seizures, loss of consciousness and headaches.  Psychiatric/Behavioral: Negative for suicidal ideas.  All other systems reviewed and are negative.     ____________________________________________   PHYSICAL EXAM:  VITAL SIGNS: ED Triage Vitals  Enc Vitals Group     BP 01/17/21 1932 (!) 135/96     Pulse Rate 01/17/21 1932 (!) 119     Resp 01/17/21 1932 20     Temp 01/17/21 1932 100.3 F (37.9 C)     Temp Source 01/17/21 1932 Oral     SpO2 01/17/21 1932 97 %     Weight 01/17/21 1934 141 lb (64 kg)     Height 01/17/21 1934 5\' 3"  (1.6 m)     Head Circumference --      Peak Flow --      Pain Score 01/17/21 1934 6     Pain Loc --      Pain Edu? --      Excl. in [place]? --    Vitals:   01/17/21 1932 01/17/21 2100  BP: (!) 135/96 (!) 125/93  Pulse: (!) 119 (!) 118  Resp: 20 (!) 28  Temp: 100.3 F (37.9 C)   SpO2: 97% 99%   Physical Exam Vitals and nursing note reviewed.  Constitutional:      General: She is not in acute distress.    Appearance: She is well-developed.  HENT:     Head: Normocephalic and atraumatic.     Right Ear: External ear normal.     Left Ear: External ear normal.     Nose: Nose normal.  Eyes:     Conjunctiva/sclera: Conjunctivae normal.  Cardiovascular:     Rate and Rhythm: Tachycardia present. Rhythm irregular.     Heart sounds: No murmur heard.   Pulmonary:     Effort: Pulmonary effort is normal. No respiratory distress.     Breath sounds: Normal breath sounds.  Abdominal:     Palpations: Abdomen is soft.     Tenderness: There is no abdominal tenderness.  Musculoskeletal:     Cervical back: Neck supple.  Skin:    General: Skin is warm and dry.     Capillary Refill: Capillary refill takes 2 to 3 seconds.  Neurological:     Mental Status: She is alert and oriented  to person, place, and time.  Psychiatric:        Mood and Affect: Mood normal.      ____________________________________________   LABS (all labs ordered are listed, but only abnormal results are displayed)  Labs Reviewed  BASIC METABOLIC PANEL - Abnormal; Notable for the following components:      Result Value   Potassium 3.3 (*)    Glucose, Bld 109 (*)    Calcium 8.5 (*)    All  other components within normal limits  CBC - Abnormal; Notable for the following components:   WBC 15.4 (*)    RBC 3.83 (*)    Hemoglobin 11.1 (*)    HCT 33.3 (*)    All other components within normal limits  D-DIMER, QUANTITATIVE - Abnormal; Notable for the following components:   D-Dimer, Quant 0.54 (*)    All other components within normal limits  PROTIME-INR - Abnormal; Notable for the following components:   Prothrombin Time 15.3 (*)    All other components within normal limits  RESP PANEL BY RT-PCR (FLU A&B, COVID) ARPGX2  CULTURE, BLOOD (SINGLE)  URINE CULTURE  TSH  MAGNESIUM  PROCALCITONIN  LACTIC ACID, PLASMA  APTT  LACTIC ACID, PLASMA  URINALYSIS, COMPLETE (UACMP) WITH MICROSCOPIC  HEPARIN LEVEL (UNFRACTIONATED)  CBC  TROPONIN I (HIGH SENSITIVITY)  TROPONIN I (HIGH SENSITIVITY)  TROPONIN I (HIGH SENSITIVITY)   ____________________________________________  EKG  A. fib with a ventricular of 128, normal axis, unremarkable intervals without clear evidence of acute ischemia or significant underlying arrhythmia. ____________________________________________  RADIOLOGY  ED MD interpretation no clear for consolidation, pneumothorax, significant effusion, overt edema or other clear acute process.  There is a very small amount of infiltrate the lingula possible consistent with early pneumonia  Official radiology report(s): DG Chest 2 View  Result Date: 01/17/2021 CLINICAL DATA:  Shortness of breath EXAM: CHEST - 2 VIEW COMPARISON:  None. FINDINGS: Streaky atelectasis or minimal  infiltrate at the lingula. No pleural effusion. Normal cardiomediastinal silhouette. No pneumothorax. IMPRESSION: Streaky atelectasis or minimal infiltrate at the lingula. Electronically Signed   By: Donavan Foil M.D.   On: 01/17/2021 20:07    ____________________________________________   PROCEDURES  Procedure(s) performed (including Critical Care):  .Critical Care Performed by: Lucrezia Starch, MD Authorized by: Lucrezia Starch, MD   Critical care provider statement:    Critical care time (minutes):  45   Critical care time was exclusive of:  Separately billable procedures and treating other patients   Critical care was necessary to treat or prevent imminent or life-threatening deterioration of the following conditions:  Sepsis   Critical care was time spent personally by me on the following activities:  Discussions with consultants, evaluation of patient's response to treatment, examination of patient, ordering and performing treatments and interventions, ordering and review of laboratory studies, ordering and review of radiographic studies, pulse oximetry, re-evaluation of patient's condition, obtaining history from patient or surrogate and review of old charts     ____________________________________________   INITIAL IMPRESSION / [place] / ED COURSE        Patient presents for assessment approximately 1 day of chest pain.  On arrival she is febrile at 100.3 and tachycardic in the 120s with otherwise stable vital signs on room air.  Differential includes symptomatic arrhythmia, pneumonia, bronchitis, anemia, metabolic derangements, and PE.  Chest x-ray consistent with possible very early pneumonia but no evidence of large effusion, edema or other clear acute intrathoracic process.  ECG with evidence of A. fib with RVR.  Will defer initial rate control and start with Tylenol for fever and some IV fluids.  BMP remarkable for K of 3.3.  This was repleted.  CBC  remarkable for WBC count of 15.4 with hemoglobin of 11 and normal platelets.  Troponin is nonelevated and thus I have low suspicion for ACS or myocarditis.  Lactic acid is nonelevated.  Procalcitonin is undetectable.  Magnesium is WNL.  TSH is WNL.  D-dimer at  0.54 is within age-adjusted limits and low suspicion for PE.  Given tachycardia, tachypnea and elevated white cell count and concern patient may be septic from pneumonia.  She was given IV antibiotics blood cultures were obtained and she was given IV fluid bolus.  We will plan to admit to medicine service for further evaluation and management.      ____________________________________________   FINAL CLINICAL IMPRESSION(S) / ED DIAGNOSES  Final diagnoses:  Community acquired pneumonia, unspecified laterality  Sepsis, due to unspecified organism, unspecified whether acute organ dysfunction present ([place])  Atrial fibrillation, unspecified type ([place])  Hypokalemia    Medications  cefTRIAXone (ROCEPHIN) 1 g in sodium chloride 0.9 % 100 mL IVPB (has no administration in time range)  azithromycin (ZITHROMAX) 500 mg in sodium chloride 0.9 % 250 mL IVPB (has no administration in time range)  diltiazem (CARDIZEM) 1 mg/mL load via infusion 10 mg (has no administration in time range)    And  diltiazem (CARDIZEM) 125 mg in dextrose 5% 125 mL (1 mg/mL) infusion (has no administration in time range)  sodium chloride 0.9 % bolus 500 mL (has no administration in time range)  heparin ADULT infusion 100 units/mL (25000 units/267mL) (has no administration in time range)  heparin bolus via infusion 3,200 Units (has no administration in time range)  acetaminophen (TYLENOL) tablet 1,000 mg (1,000 mg Oral Given 01/17/21 2045)  lactated ringers bolus 1,000 mL (1,000 mLs Intravenous New Bag/Given 01/17/21 2107)  potassium chloride SA (KLOR-CON) CR tablet 40 mEq (40 mEq Oral Given 01/17/21 2046)     ED Discharge Orders    None       Note:  This document  was prepared using Dragon voice recognition software and may include unintentional dictation errors.   Lucrezia Starch, MD 01/17/21 2149

## 2021-01-17 NOTE — H&P (Signed)
Nancy Lopez BEM:[id number] DOB: Oct 04, 1940 DOA: 01/17/2021    PCP: Maryland Pink, MD   Outpatient Specialists:  NONE    Patient arrived to ER on 01/17/21 at 1905 Referred by Attending Lucrezia Starch, MD   Patient coming from: home Lives   With family    Chief Complaint:    Chief Complaint  Patient presents with  . Neck Pain    HPI: Nancy Lopez is a 80 y.o. female with medical history significant of HTN     Presented with  Chest pain since this AM radiating to both arms and neck bilateraly She reports cold symptoms she felt congested in her chestsince this AM She deneis any blood in stool Had prior hx of diverticulitis She still works at a [employer]  Never smoker no EtOH Never had a.fib No hx of CAD  Infectious risk factors:  Reports , shortness of breath,  chest pain,     Has  been vaccinated against COVID and boosted   Initial COVID TEST  NEGATIVE   Lab Results  Component Value Date   [place] NEGATIVE 01/17/2021   [place] NEGATIVE 11/06/2019    Regarding pertinent Chronic problems:     HTN on maxide and norvasc   Chronic anemia - baseline hg Hemoglobin & Hematocrit  Recent Labs    01/17/21 1936  HGB 11.1*     While in ER: Noted to be in atrial fibrillation new onset Chest x-ray found evidence of pneumonia Started on diltiazem drip heart rate has improved IV fluids provided   ED Triage Vitals  Enc Vitals Group     BP 01/17/21 1932 (!) 135/96     Pulse Rate 01/17/21 1932 (!) 119     Resp 01/17/21 1932 20     Temp 01/17/21 1932 100.3 F (37.9 C)     Temp Source 01/17/21 1932 Oral     SpO2 01/17/21 1932 97 %     Weight 01/17/21 1934 141 lb (64 kg)     Height 01/17/21 1934 5' 3" (1.6 m)     Head Circumference --      Peak Flow --      Pain Score 01/17/21 1934 6     Pain Loc --      Pain Edu? --      Excl. in [place]? --   TMAX(24)@     _________________________________________ Significant initial  Findings: Abnormal  Labs Reviewed  BASIC METABOLIC PANEL - Abnormal; Notable for the following components:      Result Value   Potassium 3.3 (*)    Glucose, Bld 109 (*)    Calcium 8.5 (*)    All other components within normal limits  CBC - Abnormal; Notable for the following components:   WBC 15.4 (*)    RBC 3.83 (*)    Hemoglobin 11.1 (*)    HCT 33.3 (*)    All other components within normal limits  D-DIMER, QUANTITATIVE - Abnormal; Notable for the following components:   D-Dimer, Quant 0.54 (*)    All other components within normal limits  PROTIME-INR - Abnormal; Notable for the following components:   Prothrombin Time 15.3 (*)    All other components within normal limits   ____________________________________________ Ordered   CXR - minimal infiltrate    _________________________ Troponin  9 ECG: Ordered Personally reviewed by me showing: HR : 128 Rhythm:   A.fib. W RVR,    no evidence of ischemic changes QTC 417 ____________________ This  patient meets SIRS Criteria and may be septic.   The recent clinical data is shown below. Vitals:   01/17/21 1932 01/17/21 1934 01/17/21 2100  BP: (!) 135/96  (!) 125/93  Pulse: (!) 119  (!) 118  Resp: 20  (!) 28  Temp: 100.3 F (37.9 C)    TempSrc: Oral    SpO2: 97%  99%  Weight:  64 kg   Height:  5' 3" (1.6 m)      WBC     Component Value Date/Time   WBC 15.4 (H) 01/17/2021 1936     Lactic Acid, Venous    Component Value Date/Time   LATICACIDVEN 0.7 01/17/2021 1936    Procalcitonin <0.1      UA no evidence of UTI      Urine analysis:    Component Value Date/Time   COLORURINE YELLOW (A) 01/17/2021 2202   APPEARANCEUR CLEAR (A) 01/17/2021 2202   LABSPEC 1.008 01/17/2021 2202   PHURINE 7.0 01/17/2021 2202   GLUCOSEU NEGATIVE 01/17/2021 2202   HGBUR NEGATIVE 01/17/2021 2202   BILIRUBINUR NEGATIVE 01/17/2021 2202   KETONESUR NEGATIVE 01/17/2021 2202   PROTEINUR NEGATIVE 01/17/2021 2202   NITRITE NEGATIVE 01/17/2021 2202    LEUKOCYTESUR NEGATIVE 01/17/2021 2202    Results for orders placed or performed during the hospital encounter of 01/17/21  Resp Panel by RT-PCR (Flu A&B, Covid) Nasopharyngeal Swab     Status: None   Collection Time: 01/17/21  7:46 PM   Specimen: Nasopharyngeal Swab; Nasopharyngeal(NP) swabs in vial transport medium  Result Value Ref Range Status   SARS Coronavirus 2 by RT PCR NEGATIVE NEGATIVE Final         Influenza A by PCR NEGATIVE NEGATIVE Final   Influenza B by PCR NEGATIVE NEGATIVE Final            _______________________________________________ Hospitalist was called for admission for a.fib w RVR and PNA  The following Work up has been ordered so far:  Orders Placed This Encounter  Procedures  . Resp Panel by RT-PCR (Flu A&B, Covid) Nasopharyngeal Swab  . Blood culture (routine single)  . Urine culture  . DG Chest 2 View  . Basic metabolic panel  . CBC  . D-dimer, quantitative  . TSH  . Magnesium  . Procalcitonin - Baseline  . Lactic acid, plasma  . Protime-INR  . APTT  . Urinalysis, Complete w Microscopic  . Diet NPO time specified  . Document Height and Actual Weight  . Cardiac monitoring  . Saline Lock IV, Maintain IV access  . Cardiac monitoring  . Assess and Document Glasgow Coma Scale  . Document vital signs within 1-hour of fluid bolus completion.  Notify provider of abnormal vital signs despite fluid resuscitation.  . Refer to Sidebar Report: Sepsis Bundle ED/IP  . Notify provider for difficulties obtaining IV access  . Initiate Carrier Fluid Protocol  . pharmacy consult  . Consult to hospitalist  . Airborne and Contact precautions  . Pulse oximetry, continuous  . ED EKG within 10 minutes  . EKG 12-Lead  . Insert peripheral IV X 1     Following Medications were ordered in ER: Medications  cefTRIAXone (ROCEPHIN) 1 g in sodium chloride 0.9 % 100 mL IVPB (has no administration in time range)  azithromycin (ZITHROMAX) 500 mg in sodium chloride  0.9 % 250 mL IVPB (has no administration in time range)  acetaminophen (TYLENOL) tablet 1,000 mg (1,000 mg Oral Given 01/17/21 2045)  lactated ringers bolus 1,000 mL (1,000   mLs Intravenous New Bag/Given 01/17/21 2107)  potassium chloride SA (KLOR-CON) CR tablet 40 mEq (40 mEq Oral Given 01/17/21 2046)        Consult Orders  (From admission, onward)         Start     Ordered   01/17/21 2058  Consult to hospitalist  Once       Provider:  (Not yet assigned)  Question Answer Comment  Place call to: [phone]   Reason for Consult Admit   Diagnosis/Clinical Info for Consult: PNA, sepsis, afib      01/17/21 2057            OTHER Significant initial  Findings:  labs showing:    Recent Labs  Lab 01/17/21 1936  NA 135  K 3.3*  CO2 22  GLUCOSE 109*  BUN 14  CREATININE 0.69  CALCIUM 8.5*  MG 2.1    Cr   stable,    Lab Results  Component Value Date   CREATININE 0.69 01/17/2021   CREATININE 0.87 08/22/2016      Plt: Lab Results  Component Value Date   PLT 242 01/17/2021      COVID-19 Labs  Recent Labs    01/17/21 1936  DDIMER 0.54*    Lab Results  Component Value Date   SARSCOV2NAA NEGATIVE 11/06/2019        Recent Labs  Lab 01/17/21 1936  WBC 15.4*  HGB 11.1*  HCT 33.3*  MCV 86.9  PLT 242    HG/HCT   stable,       Component Value Date/Time   HGB 11.1 (L) 01/17/2021 1936   HCT 33.3 (L) 01/17/2021 1936   MCV 86.9 01/17/2021 1936        Cultures: No results found for: SDES, SPECREQUEST, CULT, REPTSTATUS   Radiological Exams on Admission: DG Chest 2 View  Result Date: 01/17/2021 CLINICAL DATA:  Shortness of breath EXAM: CHEST - 2 VIEW COMPARISON:  None. FINDINGS: Streaky atelectasis or minimal infiltrate at the lingula. No pleural effusion. Normal cardiomediastinal silhouette. No pneumothorax. IMPRESSION: Streaky atelectasis or minimal infiltrate at the lingula. Electronically Signed   By: Kim  Fujinaga M.D.   On: 01/17/2021 20:07    _______________________________________________________________________________________________________ Latest  Blood pressure (!) 125/93, pulse (!) 118, temperature 100.3 F (37.9 C), temperature source Oral, resp. rate (!) 28, height 5' 3" (1.6 m), weight 64 kg, SpO2 99 %.   Review of Systems:    Pertinent positives include:   chills, fatigue, chest pain shortness of breath at rest.  Constitutional:  No weight loss, night sweats, Fevers weight loss  HEENT:  No headaches, Difficulty swallowing,Tooth/dental problems,Sore throat,  No sneezing, itching, ear ache, nasal congestion, post nasal drip,  Cardio-vascular:  No , Orthopnea, PND, anasarca, dizziness, palpitations.no Bilateral lower extremity swelling  GI:  No heartburn, indigestion, abdominal pain, nausea, vomiting, diarrhea, change in bowel habits, loss of appetite, melena, blood in stool, hematemesis Resp:  no No dyspnea on exertion, No excess mucus, no productive cough, No non-productive cough, No coughing up of blood.No change in color of mucus.No wheezing. Skin:  no rash or lesions. No jaundice GU:  no dysuria, change in color of urine, no urgency or frequency. No straining to urinate.  No flank pain.  Musculoskeletal:  No joint pain or no joint swelling. No decreased range of motion. No back pain.  Psych:  No change in mood or affect. No depression or anxiety. No memory loss.  Neuro: no localizing neurological complaints, no tingling, no   weakness, no double vision, no gait abnormality, no slurred speech, no confusion  All systems reviewed and apart from [place] all are negative _______________________________________________________________________________________________ Past Medical History:   Past Medical History:  Diagnosis Date  . Arthritis    right knee  . Diverticulosis   . GERD (gastroesophageal reflux disease)   . Hypertension   . Osteopenia   . Personal history of colonic polyps       Past Surgical  History:  Procedure Laterality Date  . ABDOMINAL HYSTERECTOMY  1979  . BREAST CYST ASPIRATION    . CHOLECYSTECTOMY    . COLONOSCOPY WITH PROPOFOL N/A 09/06/2015   Procedure: COLONOSCOPY WITH PROPOFOL;  Surgeon: Hulen Luster, MD;  Location: [facility] ENDOSCOPY;  Service: Gastroenterology;  Laterality: N/A;  . COLONOSCOPY WITH PROPOFOL N/A 11/10/2019   Procedure: COLONOSCOPY WITH PROPOFOL;  Surgeon: Toledo, Benay Pike, MD;  Location: [facility] ENDOSCOPY;  Service: Gastroenterology;  Laterality: N/A;  . EXCISION METACARPAL MASS Right 08/31/2016   Procedure: EXCISION METACARPAL MASS;  Surgeon: Hessie Knows, MD;  Location: [facility] ORS;  Service: Orthopedics;  Laterality: Right;  . HERNIA REPAIR      Social History:  Ambulatory  Independently      reports that she has never smoked. She has never used smokeless tobacco. She reports that she does not drink alcohol and does not use drugs.     Family History:   Family History  Problem Relation Age of Onset  . Breast cancer Neg Hx    ______________________________________________________________________________________________ Allergies: No Known Allergies   Prior to Admission medications   Medication Sig Start Date End Date Taking? Authorizing Provider  alendronate (FOSAMAX) 70 MG tablet Take 70 mg by mouth once a week. Take with a full glass of water on an empty stomach.    [provider]  amLODipine (NORVASC) 5 MG tablet Take 5 mg by mouth daily. In am.    [provider]  Calcium Carb-Cholecalciferol (CALCIUM 500/D) 500-400 MG-UNIT CHEW Chew 2 tablets by mouth. In am    [provider]  cholecalciferol (VITAMIN D) 1000 units tablet Take 1,000 Units by mouth daily. In am.    [provider]  Cyanocobalamin (VITAMIN B-12) 5000 MCG SUBL Place 5,000 mcg under the tongue. In am.    [provider]  folic acid (FOLVITE) 950 MCG tablet Take 400 mcg by mouth daily. In am.    [provider]   HYDROcodone-acetaminophen (NORCO) 5-325 MG tablet Take 1 tablet by mouth every 6 (six) hours as needed for moderate pain. Patient not taking: Reported on 11/10/2019 08/31/16   Hessie Knows, MD  Lysine 500 MG TABS Take 1 tablet by mouth. In am.    [provider]  ranitidine (ZANTAC) 150 MG tablet Take 150 mg by mouth daily as needed for heartburn.    [provider]  triamterene-hydrochlorothiazide (MAXZIDE-25) 37.5-25 MG tablet Take 1 tablet by mouth daily. In am.    [provider]    ___________________________________________________________________________________________________ Physical Exam: Vitals with BMI 01/17/2021 01/17/2021 01/17/2021  Height - 5' 3" -  Weight - 141 lbs -  BMI - 93.26 -  Systolic 712 - 458  Diastolic 93 - 96  Pulse 099 - 119    1. General:  in No  Acute distress   well  -appearing 2. Psychological: Alert and Oriented 3. Head/ENT:     Dry Mucous Membranes  Head Non traumatic, neck supple                            Poor Dentition 4. SKIN  decreased Skin turgor,  Skin clean Dry and intact no rash 5. Heart: Regular rate and rhythm no  Murmur, no Rub or gallop 6. Lungs:   no wheezes or crackles   7. Abdomen: Soft,  non-tender, Non distended  bowel sounds present 8. Lower extremities: no clubbing, cyanosis, no edema 9. Neurologically Grossly intact, moving all 4 extremities equally   10. MSK: Normal range of motion    Chart has been reviewed     _______________________________________________________________  Assessment/Plan   79 y.o. female with medical history significant of HTN  Admitted for  a.fib w RVR and PNA  Present on Admission: . Atrial fibrillation with RVR (HCC) -  initially diltiazem drip was ordered currently heart rate has gone down DC order for diltiazem drip.  Rehydrate and monitor CHA2DS2/VAS Stroke Risk Points     4   Heparin was started  echogram ordered  TSH ordered    . Sepsis  (HCC) -   -SIRS criteria met with   elevated white blood cell count,       Component Value Date/Time   WBC 15.4 (H) 01/17/2021 1936     tachycardia   , RR >20 Today's Vitals   01/18/21 0100 01/18/21 0109 01/18/21 0150 01/18/21 0202  BP: 96/67  103/63 (!) 100/56  Pulse: 79 (!) 116  81  Resp: 10 (!) 25 16 (!) 24  Temp:      TempSrc:      SpO2: 96% 93%  94%  Weight:      Height:      PainSc:         This patient meets SIRS Criteria and may be septic.     -Most likely source being , pulmonary      - Obtain serial lactic acid and procalcitonin level.  - Initiated IV antibiotics   - await results of blood and urine culture  - Rehydrate aggressively           2:23 AM  . CAP (community acquired pneumonia) -    will admit for treatment of CAP will start on appropriate antibiotic coverage.   Obtain:  sputum cultures,                  Obtain respiratory panel and influenza serologies                  blood cultures if febrile or if decompensates.                   strep pneumo UA antigen,                                Provide oxygen as needed.   . Essential hypertension -did not restart home medications while on diltiazem drip will be able to restart later if blood pressure allows  . Anemia obtain anemia panel and Hemoccult stool    Other plan as per orders.  DVT prophylaxis: hepain       Code Status:    Code Status: Prior FULL CODE  care as per patient   I had personally discussed CODE STATUS with patient      Family Communication:   Family  at  Bedside    plan of care was discussed   with  Daughter  Disposition Plan:     To home once workup is complete and patient is stable   Following barriers for discharge:                            Electrolytes corrected                               Anemia corrected                                    Would benefit from PT/OT eval prior to DC  Ordered                                       Consults called: none   Admission  status:  ED Disposition    ED Disposition Condition Deputy: S[place] [[id number]]  Level of Care: Progressive Cardiac [[id number]]  Admit to Progressive based on following criteria: CARDIOVASCULAR & THORACIC of moderate stability with acute coronary syndrome symptoms/low risk myocardial infarction/hypertensive urgency/arrhythmias/heart failure potentially compromising stability and stable post cardiovascular intervention patients.  Covid Evaluation: Symptomatic Person Under Investigation (PUI)  Diagnosis: Atrial fibrillation with RVR [facility]) [[id number]]  Admitting Physician: Toy Baker [[id number]]  Attending Physician: Toy Baker [[id number]]  Estimated length of stay: past midnight tomorrow  Certification:: I certify this patient will need inpatient services for at least 2 midnights        Obs   Level of care         SDU tele indefinitely please discontinue once patient no longer qualifies COVID-19 Labs    Lab Results  Component Value Date   [place] NEGATIVE 11/06/2019     Precautions: admitted as  Covid Negative    PPE: Used by the provider:   N95  eye Goggles,  Gloves    Vanecia Limpert 01/18/2021, 2:26 AM    [facility]     after 2 AM please page floor coverage PA If 7AM-7PM, please contact the day team taking care of the patient using [email or web address]   Patient was evaluated in the context of the global COVID-19 pandemic, which necessitated consideration that the patient might be at risk for infection with the SARS-CoV-2 virus that causes COVID-19. Institutional protocols and algorithms that pertain to the evaluation of patients at risk for COVID-19 are in a state of rapid change based on information released by regulatory bodies including the CDC and federal and state organizations. These policies and algorithms were followed during the patient's care.

## 2021-01-18 ENCOUNTER — Inpatient Hospital Stay
Admit: 2021-01-18 | Discharge: 2021-01-18 | Disposition: A | Discharge status: Home / Self Care | Payer: PPO | Attending: Internal Medicine | Admitting: Internal Medicine

## 2021-01-18 DIAGNOSIS — J189 Pneumonia, unspecified organism: Secondary | ICD-10-CM | POA: Diagnosis present

## 2021-01-18 DIAGNOSIS — I10 Essential (primary) hypertension: Secondary | ICD-10-CM | POA: Diagnosis present

## 2021-01-18 DIAGNOSIS — J181 Lobar pneumonia, unspecified organism: Secondary | ICD-10-CM

## 2021-01-18 DIAGNOSIS — D649 Anemia, unspecified: Secondary | ICD-10-CM | POA: Diagnosis present

## 2021-01-18 DIAGNOSIS — A419 Sepsis, unspecified organism: Secondary | ICD-10-CM | POA: Diagnosis present

## 2021-01-18 DIAGNOSIS — D509 Iron deficiency anemia, unspecified: Secondary | ICD-10-CM

## 2021-01-18 LAB — HEPARIN LEVEL (UNFRACTIONATED)
Heparin Unfractionated: 0.38 IU/mL (ref 0.30–0.70)
Heparin Unfractionated: 0.23 IU/mL — ABNORMAL LOW (ref 0.30–0.70)
Heparin Unfractionated: 0.22 IU/mL — ABNORMAL LOW (ref 0.30–0.70)

## 2021-01-18 LAB — RETICULOCYTES
Immature Retic Fract: 12.5 % (ref 2.3–15.9)
RBC.: 3.3 MIL/uL — ABNORMAL LOW (ref 3.87–5.11)
Retic Count, Absolute: 40.9 10*3/uL (ref 19.0–186.0)
Retic Ct Pct: 1.2 % (ref 0.4–3.1)

## 2021-01-18 LAB — HEPATIC FUNCTION PANEL
ALT: 42 U/L (ref 0–44)
AST: 35 U/L (ref 15–41)
Albumin: 2.9 g/dL — ABNORMAL LOW (ref 3.5–5.0)
Alkaline Phosphatase: 94 U/L (ref 38–126)
Bilirubin, Direct: 0.1 mg/dL (ref 0.0–0.2)
Indirect Bilirubin: 0.8 mg/dL (ref 0.3–0.9)
Total Bilirubin: 0.9 mg/dL (ref 0.3–1.2)
Total Protein: 5.7 g/dL — ABNORMAL LOW (ref 6.5–8.1)

## 2021-01-18 LAB — MAGNESIUM: Magnesium: 2 mg/dL (ref 1.7–2.4)

## 2021-01-18 LAB — IRON AND TIBC
Iron: 28 ug/dL (ref 28–170)
Saturation Ratios: 12 % (ref 10.4–31.8)
TIBC: 232 ug/dL — ABNORMAL LOW (ref 250–450)
UIBC: 204 ug/dL

## 2021-01-18 LAB — CBC
HCT: 28.9 % — ABNORMAL LOW (ref 36.0–46.0)
Hemoglobin: 9.6 g/dL — ABNORMAL LOW (ref 12.0–15.0)
MCH: 29.4 pg (ref 26.0–34.0)
MCHC: 33.2 g/dL (ref 30.0–36.0)
MCV: 88.7 fL (ref 80.0–100.0)
Platelets: 206 10*3/uL (ref 150–400)
RBC: 3.26 MIL/uL — ABNORMAL LOW (ref 3.87–5.11)
RDW: 13.5 % (ref 11.5–15.5)
WBC: 11 10*3/uL — ABNORMAL HIGH (ref 4.0–10.5)
nRBC: 0 % (ref 0.0–0.2)

## 2021-01-18 LAB — PHOSPHORUS: Phosphorus: 3.2 mg/dL (ref 2.5–4.6)

## 2021-01-18 LAB — STREP PNEUMONIAE URINARY ANTIGEN: Strep Pneumo Urinary Antigen: NEGATIVE

## 2021-01-18 LAB — VITAMIN B12: Vitamin B-12: 1510 pg/mL — ABNORMAL HIGH (ref 180–914)

## 2021-01-18 LAB — FERRITIN: Ferritin: 113 ng/mL (ref 11–307)

## 2021-01-18 LAB — TROPONIN I (HIGH SENSITIVITY): Troponin I (High Sensitivity): 9 ng/L (ref <18)

## 2021-01-18 LAB — TSH: TSH: 4.311 u[IU]/mL (ref 0.350–4.500)

## 2021-01-18 LAB — FOLATE: Folate: 37 ng/mL (ref >5.9)

## 2021-01-18 MED ORDER — MAGNESIUM SULFATE 2 GM/50ML IV SOLN
2.0000 g | Freq: Once | INTRAVENOUS | Status: AC
  Administered 2021-01-18: 2 g via INTRAVENOUS
  Filled 2021-01-18: qty 50

## 2021-01-18 MED ORDER — SODIUM CHLORIDE 0.9 % IV SOLN
2.0000 g | INTRAVENOUS | Status: DC
  Administered 2021-01-18 – 2021-01-19 (×2): 2 g via INTRAVENOUS
  Filled 2021-01-18: qty 2
  Filled 2021-01-18: qty 20
  Filled 2021-01-18: qty 2

## 2021-01-18 MED ORDER — SODIUM CHLORIDE 0.9 % IV SOLN
500.0000 mg | INTRAVENOUS | Status: DC
  Administered 2021-01-19 (×2): 500 mg via INTRAVENOUS
  Filled 2021-01-18 (×3): qty 500

## 2021-01-18 MED ORDER — HYDROCODONE-ACETAMINOPHEN 5-325 MG PO TABS
1.0000 | ORAL_TABLET | ORAL | Status: DC | PRN
  Administered 2021-01-19 (×2): 2 via ORAL
  Filled 2021-01-18 (×2): qty 2

## 2021-01-18 MED ORDER — ACETAMINOPHEN 650 MG RE SUPP: 650.0000 mg | Freq: Four times a day (QID) | RECTAL | Status: DC | PRN

## 2021-01-18 MED ORDER — DILTIAZEM HCL ER COATED BEADS 180 MG PO CP24
180.0000 mg | ORAL_CAPSULE | Freq: Every day | ORAL | Status: DC
  Administered 2021-01-18 – 2021-01-20 (×3): 180 mg via ORAL
  Filled 2021-01-18 (×3): qty 1

## 2021-01-18 MED ORDER — SODIUM CHLORIDE 0.9 % IV SOLN
INTRAVENOUS | Status: DC | PRN
  Administered 2021-01-18: 250 mL via INTRAVENOUS

## 2021-01-18 MED ORDER — FAMOTIDINE 20 MG PO TABS
10.0000 mg | ORAL_TABLET | Freq: Every day | ORAL | Status: DC
  Administered 2021-01-18 – 2021-01-20 (×3): 10 mg via ORAL
  Filled 2021-01-18 (×3): qty 1

## 2021-01-18 MED ORDER — HEPARIN BOLUS VIA INFUSION
950.0000 [IU] | Freq: Once | INTRAVENOUS | Status: AC
  Administered 2021-01-18: 950 [IU] via INTRAVENOUS
  Filled 2021-01-18: qty 950

## 2021-01-18 MED ORDER — METOPROLOL TARTRATE 25 MG PO TABS
25.0000 mg | ORAL_TABLET | Freq: Two times a day (BID) | ORAL | Status: DC
  Administered 2021-01-18 (×2): 25 mg via ORAL
  Filled 2021-01-18 (×2): qty 1

## 2021-01-18 MED ORDER — SODIUM CHLORIDE 0.9 % IV SOLN
75.0000 mL/h | INTRAVENOUS | Status: AC
  Administered 2021-01-18: 75 mL/h via INTRAVENOUS

## 2021-01-18 MED ORDER — METOPROLOL TARTRATE 5 MG/5ML IV SOLN: 5.0000 mg | INTRAVENOUS | Status: DC | PRN

## 2021-01-18 MED ORDER — POTASSIUM CHLORIDE CRYS ER 20 MEQ PO TBCR
40.0000 meq | EXTENDED_RELEASE_TABLET | Freq: Once | ORAL | Status: AC
  Administered 2021-01-18: 40 meq via ORAL
  Filled 2021-01-18: qty 2

## 2021-01-18 MED ORDER — ACETAMINOPHEN 325 MG PO TABS: 650.0000 mg | ORAL_TABLET | Freq: Four times a day (QID) | ORAL | Status: DC | PRN

## 2021-01-18 MED ORDER — DIGOXIN 0.25 MG/ML IJ SOLN
0.2500 mg | Freq: Once | INTRAMUSCULAR | Status: AC
  Administered 2021-01-18: 0.25 mg via INTRAVENOUS
  Filled 2021-01-18 (×2): qty 2

## 2021-01-18 NOTE — Progress Notes (Signed)
*  PRELIMINARY RESULTS* Echocardiogram 2D Echocardiogram has been performed.  Sherrie Sport 01/18/2021, 2:46 PM

## 2021-01-18 NOTE — Consult Note (Signed)
CARDIOLOGY CONSULT NOTE               Patient ID: Nancy Lopez MRN: [id number] DOB/AGE: 21-Feb-1941 80 y.o.  Admit date: 01/17/2021 Referring Physician Leonie Man MD hospitalist Primary Physician Maryland Pink, MD primary Primary Cardiologist none Reason for Consultation atrial fibrillation new onset  HPI: Patient's ED 80 year old female with cough congestion weakness shortness of breath low-grade temperature finally came to emergency room was found to have pneumonia on chest x-ray but also rapid atrial fibrillation which is new patient was placed on short-term anticoagulation rate control antibiotic therapy there was some concern that she had sepsis from pneumonia so she was treated with hydration as well and admitted for further evaluation including inhalers and supplemental oxygen denies any chest pain approximately syncope no previous cardiac history  Review of systems complete and found to be negative unless listed above     Past Medical History:  Diagnosis Date  . Arthritis    right knee  . Diverticulosis   . GERD (gastroesophageal reflux disease)   . Hypertension   . Osteopenia   . Personal history of colonic polyps     Past Surgical History:  Procedure Laterality Date  . ABDOMINAL HYSTERECTOMY  1979  . BREAST CYST ASPIRATION    . CHOLECYSTECTOMY    . COLONOSCOPY WITH PROPOFOL N/A 09/06/2015   Procedure: COLONOSCOPY WITH PROPOFOL;  Surgeon: Hulen Luster, MD;  Location: [facility] ENDOSCOPY;  Service: Gastroenterology;  Laterality: N/A;  . COLONOSCOPY WITH PROPOFOL N/A 11/10/2019   Procedure: COLONOSCOPY WITH PROPOFOL;  Surgeon: Toledo, Benay Pike, MD;  Location: [facility] ENDOSCOPY;  Service: Gastroenterology;  Laterality: N/A;  . EXCISION METACARPAL MASS Right 08/31/2016   Procedure: EXCISION METACARPAL MASS;  Surgeon: Hessie Knows, MD;  Location: [facility] ORS;  Service: Orthopedics;  Laterality: Right;  . HERNIA REPAIR      (Not in a hospital admission)  Social History    Socioeconomic History  . Marital status: Widowed    Spouse name: Not on file  . Number of children: Not on file  . Years of education: Not on file  . Highest education level: Not on file  Occupational History  . Not on file  Tobacco Use  . Smoking status: Never Smoker  . Smokeless tobacco: Never Used  Vaping Use  . Vaping Use: Never used  Substance and Sexual Activity  . Alcohol use: No  . Drug use: No  . Sexual activity: Never  Other Topics Concern  . Not on file  Social History Narrative  . Not on file   Social Determinants of Health   Financial Resource Strain: Not on file  Food Insecurity: Not on file  Transportation Needs: Not on file  Physical Activity: Not on file  Stress: Not on file  Social Connections: Not on file  Intimate Partner Violence: Not on file    Family History  Problem Relation Age of Onset  . Breast cancer Neg Hx       Review of systems complete and found to be negative unless listed above      PHYSICAL EXAM  General: Well developed, well nourished, in no acute distress HEENT:  Normocephalic and atramatic Neck:  No JVD.  Lungs: Clear bilaterally to auscultation and percussion. Heart: HRRR . Normal S1 and S2 without gallops or murmurs.  Abdomen: Bowel sounds are positive, abdomen soft and non-tender  Msk:  Back normal, normal gait. Normal strength and tone for age. Extremities: No clubbing, cyanosis or edema.  Neuro: Alert and oriented X 3. Psych:  Good affect, responds appropriately  Labs:   Lab Results  Component Value Date   WBC 11.0 (H) 01/18/2021   HGB 9.6 (L) 01/18/2021   HCT 28.9 (L) 01/18/2021   MCV 88.7 01/18/2021   PLT 206 01/18/2021    Recent Labs  Lab 01/17/21 1936 01/18/21 0406  NA 135  --   K 3.3*  --   CL 104  --   CO2 22  --   BUN 14  --   CREATININE 0.69  --   CALCIUM 8.5*  --   PROT  --  5.7*  BILITOT  --  0.9  ALKPHOS  --  94  ALT  --  42  AST  --  35  GLUCOSE 109*  --    No results found  for: CKTOTAL, CKMB, CKMBINDEX, TROPONINI No results found for: CHOL No results found for: HDL No results found for: LDLCALC No results found for: TRIG No results found for: CHOLHDL No results found for: LDLDIRECT    Radiology: DG Chest 2 View  Result Date: 01/17/2021 CLINICAL DATA:  Shortness of breath EXAM: CHEST - 2 VIEW COMPARISON:  None. FINDINGS: Streaky atelectasis or minimal infiltrate at the lingula. No pleural effusion. Normal cardiomediastinal silhouette. No pneumothorax. IMPRESSION: Streaky atelectasis or minimal infiltrate at the lingula. Electronically Signed   By: Donavan Foil M.D.   On: 01/17/2021 20:07    EKG: Atrial fibrillation rapid ventricular response at around 115  ASSESSMENT AND PLAN:  New onset rapid atrial fibrillation Pneumonia community-acquired Hypertension Right shoulder pain Elevated white count Anemia Hypokalemia Possible sepsis . Plan Agree with admit continue rate control for atrial fibrillation short-term anticoagulation Consider advancing to long-term anticoagulation with Eliquis or Xarelto Aggressive therapy for community-acquired pneumonia which may be contributing to atrial fibrillation Continue hypertension management and control Correct electrolytes Adequate hydration for possible sepsis Agree with echocardiogram for assessment of left ventricular function  Signed: Yolonda Kida MD 01/18/2021, 1:20 PM

## 2021-01-18 NOTE — Progress Notes (Signed)
Patient ID: Nancy Lopez, female   DOB: 08-07-1941, 80 y.o.   MRN: [id number] [facility] PROGRESS NOTE  WYNELL HALBERG OVF:[id number] DOB: 1941-09-01 DOA: 01/17/2021 PCP: [place] Pink, MD  HPI/Subjective: Patient came in with chest pain and shortness of breath and found to be in rapid atrial fibrillation.  Still having some chest pain and some shortness of breath.  Heart rate this morning was still up in the 120s.  Objective: Vitals:   01/18/21 1330 01/18/21 1430  BP: 113/68 128/86  Pulse: 74 86  Resp:  17  Temp:    SpO2: 95% 95%    Intake/Output Summary (Last 24 hours) at 01/18/2021 1648 Last data filed at 01/18/2021 0203 Gross per 24 hour  Intake 535.63 ml  Output --  Net 535.63 ml   Filed Weights   01/17/21 1934  Weight: 64 kg    ROS: Review of Systems  Respiratory: Positive for cough and shortness of breath.   Cardiovascular: Positive for chest pain.  Gastrointestinal: Negative for abdominal pain, nausea and vomiting.   Exam: Physical Exam HENT:     Head: Normocephalic.     Mouth/Throat:     Pharynx: No oropharyngeal exudate.  Eyes:     General: Lids are normal.     Conjunctiva/sclera: Conjunctivae normal.     Pupils: Pupils are equal, round, and reactive to light.  Cardiovascular:     Rate and Rhythm: Tachycardia present. Rhythm irregularly irregular.     Heart sounds: Normal heart sounds, S1 normal and S2 normal.  Pulmonary:     Breath sounds: Examination of the right-lower field reveals decreased breath sounds and rhonchi. Examination of the left-lower field reveals decreased breath sounds and rhonchi. Decreased breath sounds and rhonchi present. No wheezing or rales.  Abdominal:     Palpations: Abdomen is soft.     Tenderness: There is no abdominal tenderness.  Musculoskeletal:     Right ankle: No swelling.     Left ankle: No swelling.  Skin:    General: Skin is warm.     Findings: No rash.  Neurological:     Mental Status: She is alert  and oriented to person, place, and time.       Data Reviewed: Basic Metabolic Panel: Recent Labs  Lab 01/17/21 1936 01/18/21 0406  NA 135  --   K 3.3*  --   CL 104  --   CO2 22  --   GLUCOSE 109*  --   BUN 14  --   CREATININE 0.69  --   CALCIUM 8.5*  --   MG 2.1 2.0  PHOS  --  3.2   Liver Function Tests: Recent Labs  Lab 01/18/21 0406  AST 35  ALT 42  ALKPHOS 94  BILITOT 0.9  PROT 5.7*  ALBUMIN 2.9*   CBC: Recent Labs  Lab 01/17/21 1936 01/18/21 0406  WBC 15.4* 11.0*  HGB 11.1* 9.6*  HCT 33.3* 28.9*  MCV 86.9 88.7  PLT 242 206     Recent Results (from the past 240 hour(s))  Resp Panel by RT-PCR (Flu A&B, Covid) Nasopharyngeal Swab     Status: None   Collection Time: 01/17/21  7:46 PM   Specimen: Nasopharyngeal Swab; Nasopharyngeal(NP) swabs in vial transport medium  Result Value Ref Range Status   SARS Coronavirus 2 by RT PCR NEGATIVE NEGATIVE Final    Comment: (NOTE) SARS-CoV-2 target nucleic acids are NOT DETECTED.  The SARS-CoV-2 RNA is generally detectable in upper respiratory specimens during  the acute phase of infection. The lowest concentration of SARS-CoV-2 viral copies this assay can detect is 138 copies/mL. A negative result does not preclude SARS-Cov-2 infection and should not be used as the sole basis for treatment or other patient management decisions. A negative result may occur with  improper specimen collection/handling, submission of specimen other than nasopharyngeal swab, presence of viral mutation(s) within the areas targeted by this assay, and inadequate number of viral copies(<138 copies/mL). A negative result must be combined with clinical observations, patient history, and epidemiological information. The expected result is Negative.  Fact Sheet for Patients:  EntrepreneurPulse.com.au  Fact Sheet for Healthcare Providers:  IncredibleEmployment.be  This test is no t yet approved or  cleared by the [place] FDA and  has been authorized for detection and/or diagnosis of SARS-CoV-2 by FDA under an Emergency Use Authorization (EUA). This EUA will remain  in effect (meaning this test can be used) for the duration of the COVID-19 declaration under Section 564(b)(1) of the Act, 21 U.S.C.section 360bbb-3(b)(1), unless the authorization is terminated  or revoked sooner.       Influenza A by PCR NEGATIVE NEGATIVE Final   Influenza B by PCR NEGATIVE NEGATIVE Final    Comment: (NOTE) The Xpert Xpress SARS-CoV-2/FLU/RSV plus assay is intended as an aid in the diagnosis of influenza from Nasopharyngeal swab specimens and should not be used as a sole basis for treatment. Nasal washings and aspirates are unacceptable for Xpert Xpress SARS-CoV-2/FLU/RSV testing.  Fact Sheet for Patients: EntrepreneurPulse.com.au  Fact Sheet for Healthcare Providers: IncredibleEmployment.be  This test is not yet approved or cleared by the [place] FDA and has been authorized for detection and/or diagnosis of SARS-CoV-2 by FDA under an Emergency Use Authorization (EUA). This EUA will remain in effect (meaning this test can be used) for the duration of the COVID-19 declaration under Section 564(b)(1) of the Act, 21 U.S.C. section 360bbb-3(b)(1), unless the authorization is terminated or revoked.  Performed at [facility], [address] [place], [place] [address]   Blood culture (routine single)     Status: None (Preliminary result)   Collection Time: 01/17/21 10:02 PM   Specimen: BLOOD  Result Value Ref Range Status   Specimen Description BLOOD RIGHT ANTECUBITAL  Final   Special Requests   Final    BOTTLES DRAWN AEROBIC AND ANAEROBIC Blood Culture results may not be optimal due to an inadequate volume of blood received in culture bottles   Culture   Final    NO GROWTH < 12 HOURS Performed at [facility], [address] [place], [place] [address]    Report Status PENDING  Incomplete     Studies: DG Chest 2 View  Result Date: 01/17/2021 CLINICAL DATA:  Shortness of breath EXAM: CHEST - 2 VIEW COMPARISON:  None. FINDINGS: Streaky atelectasis or minimal infiltrate at the lingula. No pleural effusion. Normal cardiomediastinal silhouette. No pneumothorax. IMPRESSION: Streaky atelectasis or minimal infiltrate at the lingula. Electronically Signed   By: Donavan Foil M.D.   On: 01/17/2021 20:07    Scheduled Meds: . diltiazem  180 mg Oral Daily  . famotidine  10 mg Oral Daily  . metoprolol tartrate  25 mg Oral BID   Continuous Infusions: . sodium chloride 75 mL/hr (01/18/21 1142)  . azithromycin    . cefTRIAXone (ROCEPHIN)  IV    . heparin 1,050 Units/hr (01/18/21 1512)    Assessment/Plan:  1. Clinical sepsis, present on admission.  Lingular lobar pneumonia, tachycardia, leukocytosis  and fever of 100.3.  Continue Rocephin and Zithromax. 2. Atrial fibrillation with rapid ventricular response.  Likely paroxysmal.  Admitting physician put on heparin drip.  This morning I started Cardizem CD 180 mg daily and then had to add metoprolol 25 mg twice daily.  Heart rate this afternoon better.  We will give magnesium. 3. Essential hypertension on Cardizem and metoprolol at this point.  Discontinue Norvasc and Maxide at this point. 4. Iron deficiency anemia.  Continue to monitor especially while on blood thinner. 5. Hypokalemia replace orally.        Code Status:     Code Status Orders  (From admission, onward)         Start     Ordered   01/18/21 0745  Full code  Continuous        01/18/21 0744        Code Status History    Date Active Date Inactive Code Status Order ID Comments User Context   08/31/2016 1157 08/31/2016 1542 Full Code [id number]  Hessie Knows, MD Inpatient   Advance Care Planning Activity     Family Communication: Tried to reach the patient's daughter Disposition Plan:  Status is: Inpatient  Dispo: The patient is from: Home              Anticipated d/c is to: Home              Patient currently medically being treated for sepsis and pneumonia and rapid atrial fibrillation   Difficult to place patient.  No.  Consultants:  Consulted [facility] cardiology  Antibiotics:  Rocephin  Zithromax  Time spent: 28 minutes  Many

## 2021-01-18 NOTE — ED Notes (Signed)
Patient is resting comfortably. Call light in reach. Fall precautions in place. Patients needs assessed at this time.

## 2021-01-18 NOTE — Consult Note (Signed)
ANTICOAGULATION CONSULT NOTE - Initial Consult  Pharmacy Consult for heparin infusion Indication: atrial fibrillation  No Known Allergies  Patient Measurements: Height: 5\' 3"  (160 cm) Weight: 64 kg (141 lb) IBW/kg (Calculated) : 52.4 Heparin Dosing Weight: 64 kg   Vital Signs: Temp: 98.6 F (37 C) (05/03 1100) Temp Source: Oral (05/03 1100) BP: 113/68 (05/03 1330) Pulse Rate: 74 (05/03 1330)  Labs: Recent Labs    01/17/21 1936 01/17/21 2202 01/18/21 0046 01/18/21 0406 01/18/21 0633 01/18/21 1428  HGB 11.1*  --   --  9.6*  --   --   HCT 33.3*  --   --  28.9*  --   --   PLT 242  --   --  206  --   --   APTT 34  --   --   --   --   --   LABPROT 15.3*  --   --   --   --   --   INR 1.2  --   --   --   --   --   HEPARINUNFRC  --   --   --   --  0.38 0.23*  CREATININE 0.69  --   --   --   --   --   TROPONINIHS 9 9 9   --   --   --     Estimated Creatinine Clearance: 51.3 mL/min (by C-G formula based on SCr of 0.69 mg/dL).   Medical History: Past Medical History:  Diagnosis Date  . Arthritis    right knee  . Diverticulosis   . GERD (gastroesophageal reflux disease)   . Hypertension   . Osteopenia   . Personal history of colonic polyps     Medications:  No prior anticoagulation noted   Assessment: 80 y.o. female  presents for assessment approximately 1 day of some bilateral upper chest pain. ECG with evidence of  New onset A. fib with RVR. Pharmacy has been consulted for initiation and management of heparin infusion. Heparin Dosing Weight: 64 kg   Goal of Therapy:  Heparin level 0.3-0.7 units/ml Monitor platelets by anticoagulation protocol: Yes   Date Time HL Rate/Comment 05/03 0633 0.38 Therapeutic; 900 un/hr 05/03 1428 0.23 Subthera; 900 > 1050 un/hr   CTM labs: hgb 11.1>9.6; Plts 242>206 APTT at BLL 34s; INR 1.2  Plan:  HL subtherapeutic x1. Bolus 950 units x1; then increase heparin gtt rate to 1050 units/hr (~2u/k/h inc) Recheck HL in 8 hrs to  confirm, then daily. Daily CBC while on heparin.  Lorna Dibble, [facility] 01/18/2021 2:53 PM

## 2021-01-18 NOTE — ED Notes (Signed)
Patient is resting comfortably. Call light in reach. Fall precautions in place. Patients needs assessed and none noted at this time.

## 2021-01-18 NOTE — Consult Note (Signed)
ANTICOAGULATION CONSULT NOTE - Initial Consult  Pharmacy Consult for heparin infusion Indication: atrial fibrillation  No Known Allergies  Patient Measurements: Height: 5\' 3"  (160 cm) Weight: 64 kg (141 lb) IBW/kg (Calculated) : 52.4 Heparin Dosing Weight: 64 kg   Vital Signs: Temp: 98 F (36.7 C) (05/03 0030) Temp Source: Oral (05/03 0030) BP: 110/81 (05/03 0630) Pulse Rate: 125 (05/03 0630)  Labs: Recent Labs    01/17/21 1936 01/17/21 2202 01/18/21 0046 01/18/21 0406 01/18/21 0633  HGB 11.1*  --   --  9.6*  --   HCT 33.3*  --   --  28.9*  --   PLT 242  --   --  206  --   APTT 34  --   --   --   --   LABPROT 15.3*  --   --   --   --   INR 1.2  --   --   --   --   HEPARINUNFRC  --   --   --   --  0.38  CREATININE 0.69  --   --   --   --   TROPONINIHS 9 9 9   --   --     Estimated Creatinine Clearance: 51.3 mL/min (by C-G formula based on SCr of 0.69 mg/dL).   Medical History: Past Medical History:  Diagnosis Date  . Arthritis    right knee  . Diverticulosis   . GERD (gastroesophageal reflux disease)   . Hypertension   . Osteopenia   . Personal history of colonic polyps     Medications:  No prior anticoagulation noted   Assessment: 80 y.o. female  presents for assessment approximately 1 day of some bilateral upper chest pain. ECG with evidence of  New onset A. fib with RVR. Pharmacy has been consulted for initiation and management of heparin infusion.  Goal of Therapy:  Heparin level 0.3-0.7 units/ml Monitor platelets by anticoagulation protocol: Yes   05/03 0633 HL 0.38, therapeutic x 1   Plan:  Continue heparin infusion at 900 units/hr Recheck HL in 8 hrs to confirm, then daily. Daily CBC while on heparin.  Renda Rolls, PharmD, [facility] 01/18/2021 7:06 AM

## 2021-01-18 NOTE — ED Notes (Signed)
Assessed ambulating O2 per MD order. Patient able to maintain O2 level between 901%-95% while ambulating independently. Pt c/o moderate SHOB upon ambulation, denies dizziness.

## 2021-01-18 NOTE — ED Notes (Addendum)
Echocardiogram currently being preformed at bedside.

## 2021-01-19 LAB — CBC WITH DIFFERENTIAL/PLATELET
Abs Immature Granulocytes: 0.06 10*3/uL (ref 0.00–0.07)
Basophils Absolute: 0.1 10*3/uL (ref 0.0–0.1)
Basophils Relative: 1 %
Eosinophils Absolute: 0.2 10*3/uL (ref 0.0–0.5)
Eosinophils Relative: 2 %
HCT: 31.2 % — ABNORMAL LOW (ref 36.0–46.0)
Hemoglobin: 10.2 g/dL — ABNORMAL LOW (ref 12.0–15.0)
Immature Granulocytes: 1 %
Lymphocytes Relative: 16 %
Lymphs Abs: 1.8 10*3/uL (ref 0.7–4.0)
MCH: 29.1 pg (ref 26.0–34.0)
MCHC: 32.7 g/dL (ref 30.0–36.0)
MCV: 88.9 fL (ref 80.0–100.0)
Monocytes Absolute: 0.9 10*3/uL (ref 0.1–1.0)
Monocytes Relative: 8 %
Neutro Abs: 7.8 10*3/uL — ABNORMAL HIGH (ref 1.7–7.7)
Neutrophils Relative %: 72 %
Platelets: 223 10*3/uL (ref 150–400)
RBC: 3.51 MIL/uL — ABNORMAL LOW (ref 3.87–5.11)
RDW: 13.4 % (ref 11.5–15.5)
WBC: 10.8 10*3/uL — ABNORMAL HIGH (ref 4.0–10.5)
nRBC: 0 % (ref 0.0–0.2)

## 2021-01-19 LAB — LEGIONELLA PNEUMOPHILA SEROGP 1 UR AG: L. pneumophila Serogp 1 Ur Ag: NEGATIVE

## 2021-01-19 LAB — ECHOCARDIOGRAM COMPLETE
AR max vel: 1.38 cm2
AV Area VTI: 1.72 cm2
AV Area mean vel: 1.3 cm2
AV Mean grad: 2 mmHg
AV Peak grad: 4.1 mmHg
Ao pk vel: 1.01 m/s
Area-P 1/2: 5.36 cm2
Height: 63 in
S' Lateral: 3.13 cm
Weight: 2256 oz

## 2021-01-19 LAB — HEPARIN LEVEL (UNFRACTIONATED): Heparin Unfractionated: 0.54 IU/mL (ref 0.30–0.70)

## 2021-01-19 LAB — BASIC METABOLIC PANEL
Anion gap: 6 (ref 5–15)
BUN: 18 mg/dL (ref 8–23)
CO2: 20 mmol/L — ABNORMAL LOW (ref 22–32)
Calcium: 8 mg/dL — ABNORMAL LOW (ref 8.9–10.3)
Chloride: 110 mmol/L (ref 98–111)
Creatinine, Ser: 0.79 mg/dL (ref 0.44–1.00)
GFR, Estimated: >60 mL/min (ref >60)
Glucose, Bld: 117 mg/dL — ABNORMAL HIGH (ref 70–99)
Potassium: 4.4 mmol/L (ref 3.5–5.1)
Sodium: 136 mmol/L (ref 135–145)

## 2021-01-19 LAB — MAGNESIUM: Magnesium: 2.2 mg/dL (ref 1.7–2.4)

## 2021-01-19 MED ORDER — HEPARIN (PORCINE) 25000 UT/250ML-% IV SOLN
1200.0000 [IU]/h | INTRAVENOUS | Status: DC
  Administered 2021-01-19: 1200 [IU]/h via INTRAVENOUS

## 2021-01-19 MED ORDER — METOPROLOL TARTRATE 50 MG PO TABS
50.0000 mg | ORAL_TABLET | Freq: Two times a day (BID) | ORAL | Status: DC
  Administered 2021-01-19 (×2): 50 mg via ORAL
  Filled 2021-01-19 (×2): qty 1

## 2021-01-19 MED ORDER — HEPARIN BOLUS VIA INFUSION
950.0000 [IU] | Freq: Once | INTRAVENOUS | Status: AC
  Administered 2021-01-19: 950 [IU] via INTRAVENOUS
  Filled 2021-01-19: qty 950

## 2021-01-19 MED ORDER — ONDANSETRON HCL 4 MG/2ML IJ SOLN
4.0000 mg | Freq: Four times a day (QID) | INTRAMUSCULAR | Status: DC | PRN
  Administered 2021-01-19: 4 mg via INTRAVENOUS
  Filled 2021-01-19: qty 2

## 2021-01-19 MED ORDER — APIXABAN 5 MG PO TABS
5.0000 mg | ORAL_TABLET | Freq: Two times a day (BID) | ORAL | Status: DC
  Administered 2021-01-19 – 2021-01-20 (×3): 5 mg via ORAL
  Filled 2021-01-19 (×3): qty 1

## 2021-01-19 NOTE — Progress Notes (Signed)
PROGRESS NOTE    Nancy Lopez  [id number] DOB: 1940/09/24 DOA: 01/17/2021 PCP: [place] Pink, MD  Brief Narrative:  80 year old female history significant for hypertension presented with chest pain x1 day with radiation to both arms.  Was found to be in rapid atrial fibrillation.  New onset.  Cardiology consulted.  Rate controlled with use of metoprolol and Cardizem.  Also had clinical features of sepsis secondary to community-acquired pneumonia.  Has been responding to Rocephin and azithromycin.     Assessment & Plan:   Active Problems:   Atrial fibrillation with RVR (HCC)   Sepsis ([place])   CAP (community acquired pneumonia)   Essential hypertension   Anemia  Community-acquired pneumonia Sepsis secondary to above Sepsis diagnosis supported with tachycardia, leukocytosis, fever Presumed source is lingular lobar Has been responding to CAP treatment Plan: Continue Rocephin and azithromycin Monitor vitals and fever curve  Atrial fibrillation with rapid ventricular response New onset Transition to Cardizem CD and metoprolol Cardiology on consult Plan: Continue Eliquis Continue Cardizem CD 180 mg daily Continue metoprolol tartrate 25 mg twice daily Monitor and replace electrolytes K greater than 4, mag greater than 2 Telemetry monitoring Ambulate as tolerated  Essential hypertension Home Norvasc and Maxide discontinued Continue Cardizem and metoprolol  Chronic iron deficiency anemia Hemoglobin at baseline with no episodes of bleeding noted Continue to monitor now with Eliquis  Hypokalemia Monitor and replace daily Goal K greater than 4   DVT prophylaxis: Eliquis Code Status: Full Family Communication: Attempted to call daughter Verlin Grills [phone].  No answer, no voicemail left on generic message Disposition Plan: Status is: Inpatient  Remains inpatient appropriate because:Inpatient level of care appropriate due to severity of illness   Dispo:  The patient is from: Home              Anticipated d/c is to: Home              Patient currently is not medically stable to d/c.   Difficult to place patient No  Community-acquired pneumonia and rapid atrial fibrillation.  Rate control improved.  Possible disposition in 24 to 48 hours.     Level of care: Progressive Cardiac  Consultants:   Cardiology   Procedures: None  Antimicrobials:   Rocephin  Azithromycin   Subjective: Patient seen and examined.  Endorses some mild nausea otherwise denies chest pain or palpitations.  No other complaints  Objective: Vitals:   01/19/21 0352 01/19/21 0804 01/19/21 1134 01/19/21 1135  BP: 120/68 127/84 110/81   Pulse: 93 (!) 105 (!) 43 84  Resp: 20 19 18    Temp: 98.2 F (36.8 C) 97.9 F (36.6 C) 97.7 F (36.5 C)   TempSrc: Oral Oral Oral   SpO2: 94% 92% 92%   Weight: 71.4 kg     Height:        Intake/Output Summary (Last 24 hours) at 01/19/2021 1304 Last data filed at 01/19/2021 1133 Gross per 24 hour  Intake 949.16 ml  Output 400 ml  Net 549.16 ml   Filed Weights   01/17/21 1934 01/18/21 1837 01/19/21 0352  Weight: 64 kg 64.1 kg 71.4 kg    Examination:  General exam: Appears calm and comfortable  Respiratory system: Scattered crackles bilaterally.  Normal work of breathing.  Room air Cardiovascular system: Regular rate, irregular rhythm, no murmurs Gastrointestinal system: Abdomen is nondistended, soft and nontender. No organomegaly or masses felt. Normal bowel sounds heard. Central nervous system: Alert and oriented. No focal neurological deficits. Extremities: Symmetric  5 x 5 power. Skin: No rashes, lesions or ulcers Psychiatry: Judgement and insight appear normal. Mood & affect appropriate.     Data Reviewed: I have personally reviewed following labs and imaging studies  CBC: Recent Labs  Lab 01/17/21 1936 01/18/21 0406 01/19/21 0750  WBC 15.4* 11.0* 10.8*  NEUTROABS  --   --  7.8*  HGB 11.1* 9.6*  10.2*  HCT 33.3* 28.9* 31.2*  MCV 86.9 88.7 88.9  PLT 242 206 295   Basic Metabolic Panel: Recent Labs  Lab 01/17/21 1936 01/18/21 0406 01/19/21 0750  NA 135  --  136  K 3.3*  --  4.4  CL 104  --  110  CO2 22  --  20*  GLUCOSE 109*  --  117*  BUN 14  --  18  CREATININE 0.69  --  0.79  CALCIUM 8.5*  --  8.0*  MG 2.1 2.0 2.2  PHOS  --  3.2  --    GFR: Estimated Creatinine Clearance: 54 mL/min (by C-G formula based on SCr of 0.79 mg/dL). Liver Function Tests: Recent Labs  Lab 01/18/21 0406  AST 35  ALT 42  ALKPHOS 94  BILITOT 0.9  PROT 5.7*  ALBUMIN 2.9*   No results for input(s): LIPASE, AMYLASE in the last 168 hours. No results for input(s): AMMONIA in the last 168 hours. Coagulation Profile: Recent Labs  Lab 01/17/21 1936  INR 1.2   Cardiac Enzymes: No results for input(s): CKTOTAL, CKMB, CKMBINDEX, TROPONINI in the last 168 hours. BNP (last 3 results) No results for input(s): PROBNP in the last 8760 hours. HbA1C: No results for input(s): HGBA1C in the last 72 hours. CBG: No results for input(s): GLUCAP in the last 168 hours. Lipid Profile: No results for input(s): CHOL, HDL, LDLCALC, TRIG, CHOLHDL, LDLDIRECT in the last 72 hours. Thyroid Function Tests: Recent Labs    01/18/21 0406  TSH 4.311   Anemia Panel: Recent Labs    01/18/21 0406  VITAMINB12 1,510*  FOLATE 37.0  FERRITIN 113  TIBC 232*  IRON 28  RETICCTPCT 1.2   Sepsis Labs: Recent Labs  Lab 01/17/21 1936 01/17/21 2202  PROCALCITON <0.10  --   LATICACIDVEN 0.7 1.0    Recent Results (from the past 240 hour(s))  Resp Panel by RT-PCR (Flu A&B, Covid) Nasopharyngeal Swab     Status: None   Collection Time: 01/17/21  7:46 PM   Specimen: Nasopharyngeal Swab; Nasopharyngeal(NP) swabs in vial transport medium  Result Value Ref Range Status   SARS Coronavirus 2 by RT PCR NEGATIVE NEGATIVE Final    Comment: (NOTE) SARS-CoV-2 target nucleic acids are NOT DETECTED.  The SARS-CoV-2  RNA is generally detectable in upper respiratory specimens during the acute phase of infection. The lowest concentration of SARS-CoV-2 viral copies this assay can detect is 138 copies/mL. A negative result does not preclude SARS-Cov-2 infection and should not be used as the sole basis for treatment or other patient management decisions. A negative result may occur with  improper specimen collection/handling, submission of specimen other than nasopharyngeal swab, presence of viral mutation(s) within the areas targeted by this assay, and inadequate number of viral copies(<138 copies/mL). A negative result must be combined with clinical observations, patient history, and epidemiological information. The expected result is Negative.  Fact Sheet for Patients:  EntrepreneurPulse.com.au  Fact Sheet for Healthcare Providers:  IncredibleEmployment.be  This test is no t yet approved or cleared by the [place] FDA and  has been authorized for detection  and/or diagnosis of SARS-CoV-2 by FDA under an Emergency Use Authorization (EUA). This EUA will remain  in effect (meaning this test can be used) for the duration of the COVID-19 declaration under Section 564(b)(1) of the Act, 21 U.S.C.section 360bbb-3(b)(1), unless the authorization is terminated  or revoked sooner.       Influenza A by PCR NEGATIVE NEGATIVE Final   Influenza B by PCR NEGATIVE NEGATIVE Final    Comment: (NOTE) The Xpert Xpress SARS-CoV-2/FLU/RSV plus assay is intended as an aid in the diagnosis of influenza from Nasopharyngeal swab specimens and should not be used as a sole basis for treatment. Nasal washings and aspirates are unacceptable for Xpert Xpress SARS-CoV-2/FLU/RSV testing.  Fact Sheet for Patients: EntrepreneurPulse.com.au  Fact Sheet for Healthcare Providers: IncredibleEmployment.be  This test is not yet approved or cleared by the  [place] FDA and has been authorized for detection and/or diagnosis of SARS-CoV-2 by FDA under an Emergency Use Authorization (EUA). This EUA will remain in effect (meaning this test can be used) for the duration of the COVID-19 declaration under Section 564(b)(1) of the Act, 21 U.S.C. section 360bbb-3(b)(1), unless the authorization is terminated or revoked.  Performed at [facility], [address] [place], [place] [address]   Blood culture (routine single)     Status: None (Preliminary result)   Collection Time: 01/17/21 10:02 PM   Specimen: BLOOD  Result Value Ref Range Status   Specimen Description BLOOD RIGHT ANTECUBITAL  Final   Special Requests   Final    BOTTLES DRAWN AEROBIC AND ANAEROBIC Blood Culture results may not be optimal due to an inadequate volume of blood received in culture bottles   Culture   Final    NO GROWTH 2 DAYS Performed at [facility], [address] [place], [place] [address]    Report Status PENDING  Incomplete  Urine culture     Status: Abnormal (Preliminary result)   Collection Time: 01/17/21 10:02 PM   Specimen: In/Out Cath Urine  Result Value Ref Range Status   Specimen Description   Final    IN/OUT CATH URINE Performed at [facility], [address], [place], [place] [address]    Special Requests   Final    NONE Performed at [facility], [address], [place], [place] [address]    Culture (A)  Final    >=100,000 COLONIES/mL ENTEROCOCCUS FAECALIS SUSCEPTIBILITIES TO FOLLOW Performed at [facility], [address], [place],  [address]    Report Status PENDING  Incomplete         Radiology Studies: DG Chest 2 View  Result Date: 01/17/2021 CLINICAL DATA:  Shortness of breath EXAM: CHEST - 2 VIEW COMPARISON:  None. FINDINGS: Streaky atelectasis or minimal infiltrate at the lingula. No pleural effusion. Normal cardiomediastinal silhouette. No pneumothorax. IMPRESSION:  Streaky atelectasis or minimal infiltrate at the lingula. Electronically Signed   By: Donavan Foil M.D.   On: 01/17/2021 20:07   ECHOCARDIOGRAM COMPLETE  Result Date: 01/19/2021    ECHOCARDIOGRAM REPORT   Patient Name:   ROZANNE MCMEEKIN [facility] Date of Exam: 01/18/2021 Medical Rec #:  [id number]        Height:       63.0 in Accession #:    [id number]       Weight:       141.0 lb Date of Birth:  [birth year]       BSA:          1.667  m Patient Age:    37 years         BP:           117/66 mmHg Patient Gender: F                HR:           115 bpm. Exam Location:  [facility] Procedure: 2D Echo, Cardiac Doppler and Color Doppler Indications:     Atrial Fibrillation I48.91  History:         Patient has no prior history of Echocardiogram examinations.                  Risk Factors:Hypertension.  Sonographer:     Sherrie Sport RDCS (AE) Referring Phys:  [id number] Loletha Grayer Diagnosing Phys: Yolonda Kida MD IMPRESSIONS  1. Mild Inferior Lateral Hypo.  2. Mild inferior lateral hypo. Left ventricular ejection fraction, by estimation, is 55 to 60%. The left ventricle has normal function. The left ventricle demonstrates regional wall motion abnormalities (see scoring diagram/findings for description). Left ventricular diastolic parameters are consistent with Grade I diastolic dysfunction (impaired relaxation).  3. Right ventricular systolic function is normal. The right ventricular size is normal.  4. The mitral valve is abnormal. Mild to moderate mitral valve regurgitation.  5. Tricuspid valve regurgitation is moderate.  6. The aortic valve is normal in structure. Aortic valve regurgitation is mild. FINDINGS  Left Ventricle: Mild inferior lateral hypo. Left ventricular ejection fraction, by estimation, is 55 to 60%. The left ventricle has normal function. The left ventricle demonstrates regional wall motion abnormalities. The left ventricular internal cavity  size was normal in size. There is no left ventricular hypertrophy. Left  ventricular diastolic parameters are consistent with Grade I diastolic dysfunction (impaired relaxation). Right Ventricle: The right ventricular size is normal. No increase in right ventricular wall thickness. Right ventricular systolic function is normal. Left Atrium: Left atrial size was normal in size. Right Atrium: Right atrial size was normal in size. Pericardium: There is no evidence of pericardial effusion. Mitral Valve: The mitral valve is abnormal. There is mild thickening of the mitral valve leaflet(s). There is mild calcification of the mitral valve leaflet(s). Normal mobility of the mitral valve leaflets. Mild to moderate mitral valve regurgitation. Tricuspid Valve: The tricuspid valve is normal in structure. Tricuspid valve regurgitation is moderate. Aortic Valve: The aortic valve is normal in structure. Aortic valve regurgitation is mild. Aortic valve mean gradient measures 2.0 mmHg. Aortic valve peak gradient measures 4.1 mmHg. Aortic valve area, by VTI measures 1.72 cm. Pulmonic Valve: The pulmonic valve was normal in structure. Pulmonic valve regurgitation is trivial. Aorta: The ascending aorta was not well visualized. IAS/Shunts: No atrial level shunt detected by color flow Doppler. Additional Comments: Mild Inferior Lateral Hypo.  LEFT VENTRICLE PLAX 2D LVIDd:         4.73 cm LVIDs:         3.13 cm LV PW:         1.03 cm LV IVS:        1.20 cm LVOT diam:     2.00 cm LV SV:         29 LV SV Index:   17 LVOT Area:     3.14 cm  RIGHT VENTRICLE RV S prime:     9.14 cm/s TAPSE (M-mode): 4.1 cm LEFT ATRIUM              Index       RIGHT  ATRIUM           Index LA diam:        5.70 cm  3.42 cm/m  RA Area:     17.50 cm LA Vol (A2C):   95.7 ml  57.42 ml/m RA Volume:   46.00 ml  27.60 ml/m LA Vol (A4C):   106.0 ml 63.60 ml/m LA Biplane Vol: 104.0 ml 62.40 ml/m  AORTIC VALVE                   PULMONIC VALVE AV Area (Vmax):    1.38 cm    PV Vmax:        0.43 m/s AV Area (Vmean):   1.30 cm    PV Peak  grad:   0.7 mmHg AV Area (VTI):     1.72 cm    RVOT Peak grad: 2 mmHg AV Vmax:           100.87 cm/s AV Vmean:          70.400 cm/s AV VTI:            0.168 m AV Peak Grad:      4.1 mmHg AV Mean Grad:      2.0 mmHg LVOT Vmax:         44.40 cm/s LVOT Vmean:        29.100 cm/s LVOT VTI:          0.092 m LVOT/AV VTI ratio: 0.55  AORTA Ao Root diam: 2.90 cm MITRAL VALVE                TRICUSPID VALVE MV Area (PHT): 5.36 cm     TR Peak grad:   33.2 mmHg MV Decel Time: 142 msec     TR Vmax:        288.00 cm/s MV E velocity: 119.50 cm/s                             SHUNTS                             Systemic VTI:  0.09 m                             Systemic Diam: 2.00 cm Dwayne Prince [place] MD Electronically signed by Yolonda Kida MD Signature Date/Time: 01/19/2021/8:51:49 AM    Final         Scheduled Meds: . apixaban  5 mg Oral BID  . diltiazem  180 mg Oral Daily  . famotidine  10 mg Oral Daily  . metoprolol tartrate  50 mg Oral BID   Continuous Infusions: . sodium chloride Stopped (01/19/21 0348)  . azithromycin 500 mg (01/19/21 0042)  . cefTRIAXone (ROCEPHIN)  IV 2 g (01/18/21 2325)     LOS: 2 days    Time spent: 25 minutes    Sidney Ace, MD [facility] Pager [phone]  If 7PM-7AM, please contact night-coverage 01/19/2021, 1:04 PM

## 2021-01-19 NOTE — Consult Note (Signed)
ANTICOAGULATION CONSULT NOTE - Initial Consult  Pharmacy Consult for heparin infusion Indication: atrial fibrillation  No Known Allergies  Patient Measurements: Height: 5\' 3"  (160 cm) Weight: 64.1 kg (141 lb 5 oz) IBW/kg (Calculated) : 52.4 Heparin Dosing Weight: 64 kg   Vital Signs: Temp: 98.5 F (36.9 C) (05/03 2015) Temp Source: Oral (05/03 2015) BP: 132/73 (05/03 2015) Pulse Rate: 108 (05/03 2015)  Labs: Recent Labs    01/17/21 1936 01/17/21 2202 01/18/21 0046 01/18/21 0406 01/18/21 0633 01/18/21 1428 01/18/21 2248  HGB 11.1*  --   --  9.6*  --   --   --   HCT 33.3*  --   --  28.9*  --   --   --   PLT 242  --   --  206  --   --   --   APTT 34  --   --   --   --   --   --   LABPROT 15.3*  --   --   --   --   --   --   INR 1.2  --   --   --   --   --   --   HEPARINUNFRC  --   --   --   --  0.38 0.23* 0.22*  CREATININE 0.69  --   --   --   --   --   --   TROPONINIHS 9 9 9   --   --   --   --     Estimated Creatinine Clearance: 51.4 mL/min (by C-G formula based on SCr of 0.69 mg/dL).   Medical History: Past Medical History:  Diagnosis Date  . Arthritis    right knee  . Diverticulosis   . GERD (gastroesophageal reflux disease)   . Hypertension   . Osteopenia   . Personal history of colonic polyps     Medications:  No prior anticoagulation noted   Assessment: 80 y.o. female  presents for assessment approximately 1 day of some bilateral upper chest pain. ECG with evidence of  New onset A. fib with RVR. Pharmacy has been consulted for initiation and management of heparin infusion. Heparin Dosing Weight: 64 kg   Goal of Therapy:  Heparin level 0.3-0.7 units/ml Monitor platelets by anticoagulation protocol: Yes   Date Time HL Rate/Comment 05/03 0633 0.38 Therapeutic; 900 un/hr 05/03 1428 0.23 Subthera; 900 > 1050 un/hr 5/03 2248 0.22 Subtherapeutic   CTM labs: hgb 11.1>9.6; Plts 242>206 APTT at BLL 34s; INR 1.2  Plan:  HL subtherapeutic. Bolus 950  units x1; then increase heparin gtt rate to 1200 units/hr Recheck HL in 8 hrs. Daily CBC while on heparin.  Renda Rolls, PharmD, [facility] 01/19/2021 12:04 AM

## 2021-01-19 NOTE — Progress Notes (Signed)
[facility] Cardiology    SUBJECTIVE: Patient states to be doing reasonably well has had symptoms congestion no significant chest pain significant right shoulder discomfort and neck pain no history of smoking no previous cardiac history improved from first presentation   Vitals:   01/18/21 1837 01/18/21 2015 01/19/21 0352 01/19/21 0804  BP:  132/73 120/68 127/84  Pulse:  (!) 108 93 (!) 105  Resp:  20 20 19   Temp:  98.5 F (36.9 C) 98.2 F (36.8 C) 97.9 F (36.6 C)  TempSrc:  Oral Oral Oral  SpO2:  94% 94% 92%  Weight: 64.1 kg  71.4 kg   Height: 5\' 3"  (1.6 m)        Intake/Output Summary (Last 24 hours) at 01/19/2021 2094 Last data filed at 01/19/2021 0300 Gross per 24 hour  Intake 709.16 ml  Output --  Net 709.16 ml      PHYSICAL EXAM  General: Well developed, well nourished, in no acute distress HEENT:  Normocephalic and atramatic Neck:  No JVD.  Lungs: Clear bilaterally to auscultation and percussion. Heart: Irregular. Normal S1 and S2 without gallops or murmurs.  Abdomen: Bowel sounds are positive, abdomen soft and non-tender  Msk:  Back normal, normal gait. Normal strength and tone for age. Extremities: No clubbing, cyanosis or edema.   Neuro: Alert and oriented X 3. Psych:  Good affect, responds appropriately   LABS: Basic Metabolic Panel: Recent Labs    01/17/21 1936 01/18/21 0406  NA 135  --   K 3.3*  --   CL 104  --   CO2 22  --   GLUCOSE 109*  --   BUN 14  --   CREATININE 0.69  --   CALCIUM 8.5*  --   MG 2.1 2.0  PHOS  --  3.2   Liver Function Tests: Recent Labs    01/18/21 0406  AST 35  ALT 42  ALKPHOS 94  BILITOT 0.9  PROT 5.7*  ALBUMIN 2.9*   No results for input(s): LIPASE, AMYLASE in the last 72 hours. CBC: Recent Labs    01/18/21 0406 01/19/21 0750  WBC 11.0* 10.8*  NEUTROABS  --  7.8*  HGB 9.6* 10.2*  HCT 28.9* 31.2*  MCV 88.7 88.9  PLT 206 223   Cardiac Enzymes: No results for input(s): CKTOTAL, CKMB, CKMBINDEX, TROPONINI in  the last 72 hours. BNP: Invalid input(s): POCBNP D-Dimer: Recent Labs    01/17/21 1936  DDIMER 0.54*   Hemoglobin A1C: No results for input(s): HGBA1C in the last 72 hours. Fasting Lipid Panel: No results for input(s): CHOL, HDL, LDLCALC, TRIG, CHOLHDL, LDLDIRECT in the last 72 hours. Thyroid Function Tests: Recent Labs    01/18/21 0406  TSH 4.311   Anemia Panel: Recent Labs    01/18/21 0406  VITAMINB12 1,510*  FOLATE 37.0  FERRITIN 113  TIBC 232*  IRON 28  RETICCTPCT 1.2    DG Chest 2 View  Result Date: 01/17/2021 CLINICAL DATA:  Shortness of breath EXAM: CHEST - 2 VIEW COMPARISON:  None. FINDINGS: Streaky atelectasis or minimal infiltrate at the lingula. No pleural effusion. Normal cardiomediastinal silhouette. No pneumothorax. IMPRESSION: Streaky atelectasis or minimal infiltrate at the lingula. Electronically Signed   By: Donavan Foil M.D.   On: 01/17/2021 20:07     Echo preserved left ventricular function EF around 50-55 mild inferior lateral hypomild valvular regurgitation aortic mitral with moderate tricuspid  TELEMETRY: Atrial fibrillation rate around 110 nonspecific findings  ASSESSMENT AND PLAN:  Active Problems:  Atrial fibrillation with RVR ([place])   Sepsis ([place])   CAP (community acquired pneumonia)   Essential hypertension   Anemia    Plan Continue rate control and anticoagulation for atrial fibrillation Consider switching to p.o. amiodarone metoprolol and possibly Eliquis Agree with antibiotic therapy for pneumonia or bronchitis Continue to correct electrolytes Iron therapy for anemia consider outpatient work-up and evaluation for mild anemia Sepsis appears to be controlled with adequate blood pressure and antibiotic therapy No claudication flow supplemental oxygen therapy Increase activity Agree with echocardiogram for assessment of LV function and valvular structures   Yolonda Kida, MD 01/19/2021 8:12 AM

## 2021-01-19 NOTE — Consult Note (Addendum)
[place] for heparin infusion Indication: atrial fibrillation  No Known Allergies  Patient Measurements: Height: 5\' 3"  (160 cm) Weight: 71.4 kg (157 lb 6.4 oz) IBW/kg (Calculated) : 52.4 Heparin Dosing Weight: 64 kg   Vital Signs: Temp: 97.9 F (36.6 C) (05/04 0804) Temp Source: Oral (05/04 0804) BP: 127/84 (05/04 0804) Pulse Rate: 105 (05/04 0804)  Labs: Recent Labs    01/17/21 1936 01/17/21 2202 01/18/21 0046 01/18/21 0406 01/18/21 2841 01/18/21 1428 01/18/21 2248 01/19/21 0750  HGB 11.1*  --   --  9.6*  --   --   --  10.2*  HCT 33.3*  --   --  28.9*  --   --   --  31.2*  PLT 242  --   --  206  --   --   --  223  APTT 34  --   --   --   --   --   --   --   LABPROT 15.3*  --   --   --   --   --   --   --   INR 1.2  --   --   --   --   --   --   --   HEPARINUNFRC  --   --   --   --    < > 0.23* 0.22* 0.54  CREATININE 0.69  --   --   --   --   --   --  0.79  TROPONINIHS 9 9 9   --   --   --   --   --    < > = values in this interval not displayed.    Estimated Creatinine Clearance: 54 mL/min (by C-G formula based on SCr of 0.79 mg/dL).   Medical History: Past Medical History:  Diagnosis Date  . Arthritis    right knee  . Diverticulosis   . GERD (gastroesophageal reflux disease)   . Hypertension   . Osteopenia   . Personal history of colonic polyps     Medications:  No prior anticoagulation noted   Assessment: 80 y.o. female  presents for assessment approximately 1 day of some bilateral upper chest pain. ECG with evidence of  New onset A. fib with RVR. Pharmacy has been consulted for initiation and management of heparin infusion. CHADSASc 3.  Heparin Dosing Weight: 64 kg   Goal of Therapy:  Heparin level 0.3-0.7 units/ml Monitor platelets by anticoagulation protocol: Yes   Date Time HL Rate/Comment 05/03 0633 0.38 Therapeutic; 900 un/hr 05/03 1428 0.23 Subthera; 900 > 1050  un/hr 05/03 2248 0.22 Subtherapeutic 05/03 0750 0.54 Therapeutic    Plan:  Heparin level is therapeutic. Will switch heparin to apixaban 5 mg BID. Pt does not meet criteria for a dose reduction. CBC at least every 3 days.   Eleonore Chiquito, PharmD 01/19/2021 8:30 AM

## 2021-01-19 NOTE — Progress Notes (Signed)
[facility]    SUBJECTIVE: Patient feels reasonably well but still complains of right neck and shoulder discomfort no shortness of breath denies any palpitations or tachycardia no chest pain   Vitals:   01/18/21 1837 01/18/21 2015 01/19/21 0352 01/19/21 0804  BP:  132/73 120/68 127/84  Pulse:  (!) 108 93 (!) 105  Resp:  20 20 19   Temp:  98.5 F (36.9 C) 98.2 F (36.8 C) 97.9 F (36.6 C)  TempSrc:  Oral Oral Oral  SpO2:  94% 94% 92%  Weight: 64.1 kg  71.4 kg   Height: 5\' 3"  (1.6 m)        Intake/Output Summary (Last 24 hours) at 01/19/2021 7829 Last data filed at 01/19/2021 0300 Gross per 24 hour  Intake 709.16 ml  Output --  Net 709.16 ml      PHYSICAL EXAM  General: Well developed, well nourished, in no acute distress HEENT:  Normocephalic and atramatic Neck:  No JVD.  Lungs: Clear bilaterally to auscultation and percussion. Heart: Irregular. Normal S1 and S2 without gallops or murmurs.  Abdomen: Bowel sounds are positive, abdomen soft and non-tender  Msk:  Back normal, normal gait. Normal strength and tone for age. Extremities: No clubbing, cyanosis or edema.   Neuro: Alert and oriented X 3. Psych:  Good affect, responds appropriately   LABS: Basic Metabolic Panel: Recent Labs    01/17/21 1936 01/18/21 0406  NA 135  --   K 3.3*  --   CL 104  --   CO2 22  --   GLUCOSE 109*  --   BUN 14  --   CREATININE 0.69  --   CALCIUM 8.5*  --   MG 2.1 2.0  PHOS  --  3.2   Liver Function Tests: Recent Labs    01/18/21 0406  AST 35  ALT 42  ALKPHOS 94  BILITOT 0.9  PROT 5.7*  ALBUMIN 2.9*   No results for input(s): LIPASE, AMYLASE in the last 72 hours. CBC: Recent Labs    01/18/21 0406 01/19/21 0750  WBC 11.0* 10.8*  NEUTROABS  --  7.8*  HGB 9.6* 10.2*  HCT 28.9* 31.2*  MCV 88.7 88.9  PLT 206 223   Cardiac Enzymes: No results for input(s): CKTOTAL, CKMB, CKMBINDEX, TROPONINI in the last 72 hours. BNP: Invalid input(s):  POCBNP D-Dimer: Recent Labs    01/17/21 1936  DDIMER 0.54*   Hemoglobin A1C: No results for input(s): HGBA1C in the last 72 hours. Fasting Lipid Panel: No results for input(s): CHOL, HDL, LDLCALC, TRIG, CHOLHDL, LDLDIRECT in the last 72 hours. Thyroid Function Tests: Recent Labs    01/18/21 0406  TSH 4.311   Anemia Panel: Recent Labs    01/18/21 0406  VITAMINB12 1,510*  FOLATE 37.0  FERRITIN 113  TIBC 232*  IRON 28  RETICCTPCT 1.2    DG Chest 2 View  Result Date: 01/17/2021 CLINICAL DATA:  Shortness of breath EXAM: CHEST - 2 VIEW COMPARISON:  None. FINDINGS: Streaky atelectasis or minimal infiltrate at the lingula. No pleural effusion. Normal cardiomediastinal silhouette. No pneumothorax. IMPRESSION: Streaky atelectasis or minimal infiltrate at the lingula. Electronically Signed   By: Donavan Foil M.D.   On: 01/17/2021 20:07     Echo preserved left ventricular function 55  TELEMETRY: Atrial fibrillation rate of around 110 nonspecific findings:  ASSESSMENT AND PLAN:  Active Problems:   Atrial fibrillation with RVR ([place])   Sepsis ([place])   CAP (community acquired pneumonia)   Essential hypertension  Anemia    Plan Continue rate control for new onset atrial fibrillation continue anticoagulation Considers advancing to p.o. for rate control rhythm control as well as anticoagulation Maintain antibiotic therapy for pneumonia Supplemental oxygen therapy as necessary Continue hypertension management and control Increase activity to ambulate in halls   Yolonda Kida, MD 01/19/2021 8:19 AM

## 2021-01-19 NOTE — Evaluation (Signed)
Occupational Therapy Evaluation Patient Details Name: Nancy Lopez MRN: [id number] DOB: 1941-02-03 Today's Date: 01/19/2021    History of Present Illness 80 year old female history significant for hypertension presented with chest pain x1 day with radiation to both arms.  Was found to be in rapid atrial fibrillation.  New onset.  Cardiology consulted.  Rate controlled with use of metoprolol and Cardizem.  Also had clinical features of sepsis secondary to community-acquired pneumonia.   Clinical Impression   Pt reports living at home with daughter PTA. She reports working full time, driving, and being fully independent in all aspects of care and IADLs without use of ADs. Pt demonstrates ambulation and self care tasks this session without LOB or assist. OT discussed home set up and pt with no concerns at this time. Pt does not need skilled OT intervention. OT to  SIGN OFF. RN was called per pt request secondary to pain in R neck and shoulder.    Follow Up Recommendations  No OT follow up;Supervision - Intermittent    Equipment Recommendations  None recommended by OT       Precautions / Restrictions Precautions Precautions: Fall      Mobility Bed Mobility Overal bed mobility: Independent                  Transfers Overall transfer level: Independent Equipment used: None                  Balance Overall balance assessment: Independent                                         ADL either performed or assessed with clinical judgement   ADL Overall ADL's : Independent                                             Vision Patient Visual Report: No change from baseline              Pertinent Vitals/Pain Pain Assessment: 0-10 Pain Score: 7  Pain Location: R neck and shoulder Pain Descriptors / Indicators: Discomfort Pain Intervention(s): Limited activity within patient's tolerance;Patient requesting pain meds-RN notified      Hand Dominance Right   Extremity/Trunk Assessment Upper Extremity Assessment Upper Extremity Assessment: Overall WFL for tasks assessed   Lower Extremity Assessment Lower Extremity Assessment: Overall WFL for tasks assessed       Communication Communication Communication: No difficulties   Cognition Arousal/Alertness: Awake/alert Behavior During Therapy: WFL for tasks assessed/performed Overall Cognitive Status: Within Functional Limits for tasks assessed                                     General Comments  Pt does report feeling unsteady and gait is much slower but no LOB during session.            Home Living Family/patient expects to be discharged to:: Private residence Living Arrangements: Children Available Help at Discharge: Family;Available PRN/intermittently Type of Home: House Home Access: Stairs to enter [employer] of Steps: 2 STE Entrance Stairs-Rails: Right;Left Home Layout: One level     Bathroom Shower/Tub: Tub/shower unit         Home Equipment: None  Prior Functioning/Environment Level of Independence: Independent        Comments: Pt works full time and lives with daughter. Independent in all aspects of care and IADLs . Drives self.                 OT Goals(Current goals can be found in the care plan section) Acute Rehab OT Goals Patient Stated Goal: to go home OT Goal Formulation: With patient Potential to Achieve Goals: Good   AM-PAC OT "6 Clicks" Daily Activity     Outcome Measure Help from another person eating meals?: None Help from another person taking care of personal grooming?: None Help from another person toileting, which includes using toliet, bedpan, or urinal?: None Help from another person bathing (including washing, rinsing, drying)?: None Help from another person to put on and taking off regular upper body clothing?: None Help from another person to put on and taking off  regular lower body clothing?: None 6 Click Score: 24   End of Session Nurse Communication: Mobility status;Patient requests pain meds  Activity Tolerance: Patient tolerated treatment well Patient left: in bed;with call bell/phone within reach;with bed alarm set                   Time: 1340-1400 OT Time Calculation (min): 20 min Charges:  OT General Charges $OT Visit: 1 Visit OT Evaluation $OT Eval Low Complexity: 1 Low OT Treatments $Therapeutic Activity: 8-22 mins   Darleen Crocker, MS, OTR/L , CBIS ascom [phone]  01/19/21, 2:14 PM

## 2021-01-19 NOTE — Evaluation (Signed)
Physical Therapy Evaluation Patient Details Name: Nancy Lopez MRN: [id number] DOB: 1940/11/21 Today's Date: 01/19/2021   History of Present Illness  80 year old female history significant for hypertension presented with chest pain x1 day with radiation to both arms.  Was found to be in rapid atrial fibrillation.  New onset.  Cardiology consulted.  Rate controlled with use of metoprolol and Cardizem.  Also had clinical features of sepsis secondary to community-acquired pneumonia.  Clinical Impression  Pt confident and safety with mobility and prolonged bout of ambulation.  She did have some mild fatigue w/o chest pain, etc HR was variable during the effort 80s-110.  She works 10 hour shifts, on her feet most of the time, and while she is not yet back to her baseline she will be safe to return home and get back to PLOF w/o further PT intervention.    Follow Up Recommendations No PT follow up    Equipment Recommendations  None recommended by PT (did discuss possibly using a SPC if R knee limp/buckling worsens)    Recommendations for Other Services       Precautions / Restrictions Precautions Precautions: Fall Restrictions Weight Bearing Restrictions: No      Mobility  Bed Mobility Overal bed mobility: Independent                  Transfers Overall transfer level: Independent Equipment used: None                Ambulation/Gait Ambulation/Gait assistance: Modified independent (Device/Increase time) Gait Distance (Feet): 250 Feet Assistive device: None       General Gait Details: Pt with mild limp on R with consisent but low grade "catching" with no LOBs.  She did endorse some very mild fatigue but HR stayed 80s to 110s (and was highly variable in this range t/o the effort)  Stairs  Pt easily negotiated up/down 6 steps with light single hand rail use and reciprocal strategy          Wheelchair Mobility    Modified Rankin (Stroke Patients Only)        Balance Overall balance assessment: Independent                                           Pertinent Vitals/Pain Pain Assessment: 0-10 Pain Score: 8  Pain Location: R neck and shoulder Pain Descriptors / Indicators: Discomfort Pain Intervention(s): Limited activity within patient's tolerance;Patient requesting pain meds-RN notified    Home Living Family/patient expects to be discharged to:: Private residence Living Arrangements: Children Available Help at Discharge: Family;Available PRN/intermittently Type of Home: House Home Access: Stairs to enter Entrance Stairs-Rails: Psychiatric nurse of Steps: 2 STE Home Layout: One level Home Equipment: None      Prior Function Level of Independence: Independent         Comments: Pt works full time and lives with daughter. Independent in all aspects of care and IADLs . Drives self.     Hand Dominance   Dominant Hand: Right    Extremity/Trunk Assessment   Upper Extremity Assessment Upper Extremity Assessment: Overall WFL for tasks assessed (hesitant with R UE, but functional range)    Lower Extremity Assessment Lower Extremity Assessment: Overall WFL for tasks assessed       Communication   Communication: No difficulties  Cognition Arousal/Alertness: Awake/alert Behavior During Therapy: WFL for tasks assessed/performed Overall  Cognitive Status: Within Functional Limits for tasks assessed                                        General Comments General comments (skin integrity, edema, etc.): Pt does report feeling unsteady and gait is much slower but no LOB during session.    Exercises     Assessment/Plan    PT Assessment Patent does not need any further PT services  PT Problem List         PT Treatment Interventions      PT Goals (Current goals can be found in the Care Plan section)  Acute Rehab PT Goals Patient Stated Goal: to go home PT Goal  Formulation: All assessment and education complete, DC therapy    Frequency     Barriers to discharge        Co-evaluation               AM-PAC PT "6 Clicks" Mobility  Outcome Measure Help needed turning from your back to your side while in a flat bed without using bedrails?: None Help needed moving from lying on your back to sitting on the side of a flat bed without using bedrails?: None Help needed moving to and from a bed to a chair (including a wheelchair)?: None Help needed standing up from a chair using your arms (e.g., wheelchair or bedside chair)?: None Help needed to walk in hospital room?: None Help needed climbing 3-5 steps with a railing? : None 6 Click Score: 24    End of Session Equipment Utilized During Treatment: Gait belt Activity Tolerance: Patient tolerated treatment well Patient left: in chair;with call bell/phone within reach Nurse Communication: Mobility status (HR during activity) PT Visit Diagnosis: Difficulty in walking, not elsewhere classified (R26.2)    Time: 0109-3235 PT Time Calculation (min) (ACUTE ONLY): 22 min   Charges:   PT Evaluation $PT Eval Low Complexity: 1 Low          Kreg Shropshire, DPT 01/19/2021, 3:46 PM

## 2021-01-20 LAB — URINE CULTURE: Culture: >=100000 — AB

## 2021-01-20 LAB — CBC WITH DIFFERENTIAL/PLATELET
Abs Immature Granulocytes: 0.07 10*3/uL (ref 0.00–0.07)
Basophils Absolute: 0.1 10*3/uL (ref 0.0–0.1)
Basophils Relative: 1 %
Eosinophils Absolute: 0.2 10*3/uL (ref 0.0–0.5)
Eosinophils Relative: 2 %
HCT: 30.1 % — ABNORMAL LOW (ref 36.0–46.0)
Hemoglobin: 10.1 g/dL — ABNORMAL LOW (ref 12.0–15.0)
Immature Granulocytes: 1 %
Lymphocytes Relative: 19 %
Lymphs Abs: 2.1 10*3/uL (ref 0.7–4.0)
MCH: 29.4 pg (ref 26.0–34.0)
MCHC: 33.6 g/dL (ref 30.0–36.0)
MCV: 87.8 fL (ref 80.0–100.0)
Monocytes Absolute: 1 10*3/uL (ref 0.1–1.0)
Monocytes Relative: 9 %
Neutro Abs: 7.5 10*3/uL (ref 1.7–7.7)
Neutrophils Relative %: 68 %
Platelets: 237 10*3/uL (ref 150–400)
RBC: 3.43 MIL/uL — ABNORMAL LOW (ref 3.87–5.11)
RDW: 13.5 % (ref 11.5–15.5)
WBC: 10.9 10*3/uL — ABNORMAL HIGH (ref 4.0–10.5)
nRBC: 0 % (ref 0.0–0.2)

## 2021-01-20 LAB — BASIC METABOLIC PANEL
Anion gap: 6 (ref 5–15)
BUN: 14 mg/dL (ref 8–23)
CO2: 20 mmol/L — ABNORMAL LOW (ref 22–32)
Calcium: 8.3 mg/dL — ABNORMAL LOW (ref 8.9–10.3)
Chloride: 111 mmol/L (ref 98–111)
Creatinine, Ser: 0.74 mg/dL (ref 0.44–1.00)
GFR, Estimated: >60 mL/min (ref >60)
Glucose, Bld: 102 mg/dL — ABNORMAL HIGH (ref 70–99)
Potassium: 4.1 mmol/L (ref 3.5–5.1)
Sodium: 137 mmol/L (ref 135–145)

## 2021-01-20 LAB — MAGNESIUM: Magnesium: 2.2 mg/dL (ref 1.7–2.4)

## 2021-01-20 MED ORDER — CEFUROXIME AXETIL 500 MG PO TABS: 500.0000 mg | ORAL_TABLET | Freq: Two times a day (BID) | ORAL | 0 refills | Status: AC

## 2021-01-20 MED ORDER — METOPROLOL SUCCINATE ER 100 MG PO TB24
100.0000 mg | ORAL_TABLET | Freq: Every day | ORAL | 0 refills | Status: DC
Start: 2021-01-21 — End: 2021-03-08

## 2021-01-20 MED ORDER — CEFUROXIME AXETIL 500 MG PO TABS
500.0000 mg | ORAL_TABLET | Freq: Two times a day (BID) | ORAL | Status: DC
  Filled 2021-01-20: qty 1

## 2021-01-20 MED ORDER — APIXABAN 5 MG PO TABS
5.0000 mg | ORAL_TABLET | Freq: Two times a day (BID) | ORAL | 0 refills | Status: DC
Start: 2021-01-20 — End: 2022-03-01

## 2021-01-20 MED ORDER — DILTIAZEM HCL ER COATED BEADS 180 MG PO CP24: 180.0000 mg | ORAL_CAPSULE | Freq: Every day | ORAL | 0 refills | Status: DC

## 2021-01-20 MED ORDER — METOPROLOL SUCCINATE ER 100 MG PO TB24
100.0000 mg | ORAL_TABLET | Freq: Every day | ORAL | Status: DC
  Administered 2021-01-20: 100 mg via ORAL
  Filled 2021-01-20: qty 1

## 2021-01-20 NOTE — Discharge Summary (Signed)
Physician Discharge Summary  Nancy Lopez [id number] DOB: 09-Sep-1941 DOA: 01/17/2021  PCP: Maryland Pink, MD  Admit date: 01/17/2021 Discharge date: 01/20/2021  Admitted From: Home Disposition: Home  Recommendations for Outpatient Follow-up:  1. Follow up with PCP in 1-2 weeks 2. Follow-up with cardiology in 7 to 10 days  Home Health: No Equipment/Devices: None Discharge Condition: Stable CODE STATUS: Full Diet recommendation: Heart healthy Brief/Interim Summary: 80 year old female history significant for hypertension presented with chest pain x1 day with radiation to both arms.  Was found to be in rapid atrial fibrillation.  New onset.  Cardiology consulted.  Rate controlled with use of metoprolol and Cardizem.  Also had clinical features of sepsis secondary to community-acquired pneumonia.  Has been responding to Rocephin and azithromycin.  5/5: On the day of discharge patient was in my cardiology.  Rate control improved.  Patient feels well.  No feelings of fatigue or lethargy that preceded her admission.  Good rate control achieved with commendation of beta-blocker and calcium channel blocker.  Lengthy discussion regarding safety while on blood thinning agents.  Patient expressed understanding.  I have also discussed discharge plan with the patient's daughter via phone.  There was concern about patient's right shoulder pain.  I evaluated the patient at bedside before discharge.  Pain could be musculoskeletal in origin versus referred pain from the infectious process.  I have recommended a trial of rest, judicious use of Tylenol or anti-inflammatories and reassessment with PCP follow-up.  Patient expressed understanding of all post discharge plan of care.  All questions answered.  Patient discharged home in stable condition.  Discharge Diagnoses:  Active Problems:   Atrial fibrillation with RVR (HCC)   Sepsis ([place])   CAP (community acquired pneumonia)   Essential hypertension    Anemia  Community-acquired pneumonia Sepsis secondary to above Sepsis diagnosis supported with tachycardia, leukocytosis, fever Presumed source is lingular lobar Has been responding to CAP treatment On room air afebrile at time of discharge Completed course of azithromycin Rocephin transition to p.o. Ceftin Complete additional 4 days at time of discharge  Atrial fibrillation with rapid ventricular response New onset Transition to Cardizem CD and metoprolol Cardiology on consult At time of discharge recommend Eliquis, Cardizem CD 180 mg daily, metoprolol 1600 mg daily. Stable for discharge at this time.  Follow-up 7 to 10 days with cardiology  Essential hypertension Home Norvasc and Maxide discontinued Continue Cardizem and metoprolol at time of discharge  Chronic iron deficiency anemia Hemoglobin at baseline with no episodes of bleeding noted Continue to monitor now with Eliquis   Discharge Instructions  Discharge Instructions    Diet - low sodium heart healthy   Complete by: As directed    Increase activity slowly   Complete by: As directed      Allergies as of 01/20/2021   No Known Allergies     Medication List    STOP taking these medications   amLODipine 5 MG tablet Commonly known as: NORVASC   triamterene-hydrochlorothiazide 37.5-25 MG tablet Commonly known as: MAXZIDE-25     TAKE these medications   apixaban 5 MG Tabs tablet Commonly known as: ELIQUIS Take 1 tablet (5 mg total) by mouth 2 (two) times daily.   cefUROXime 500 MG tablet Commonly known as: CEFTIN Take 1 tablet (500 mg total) by mouth 2 (two) times daily with a meal for 4 days.   diltiazem 180 MG 24 hr capsule Commonly known as: CARDIZEM CD Take 1 capsule (180 mg total) by mouth daily.  Start taking on: Jan 21, 2021   metoprolol succinate 100 MG 24 hr tablet Commonly known as: TOPROL-XL Take 1 tablet (100 mg total) by mouth daily. Take with or immediately following a meal. Start  taking on: Jan 21, 2021       Follow-up Information    Yolonda Kida, MD. Go on 01/26/2021.   Specialties: Cardiology, Internal Medicine Why: @ 3:00pm Contact information: [place] [address] [phone]        Schedule an appointment as soon as possible for a visit with Maryland Pink, MD.   Specialty: Family Medicine Why: Clinic will call patient to schedule appt. Contact information: [place] [address] [phone]              No Known Allergies  Consultations:  Cardiology   Procedures/Studies: DG Chest 2 View  Result Date: 01/17/2021 CLINICAL DATA:  Shortness of breath EXAM: CHEST - 2 VIEW COMPARISON:  None. FINDINGS: Streaky atelectasis or minimal infiltrate at the lingula. No pleural effusion. Normal cardiomediastinal silhouette. No pneumothorax. IMPRESSION: Streaky atelectasis or minimal infiltrate at the lingula. Electronically Signed   By: Donavan Foil M.D.   On: 01/17/2021 20:07   ECHOCARDIOGRAM COMPLETE  Result Date: 01/19/2021    ECHOCARDIOGRAM REPORT   Patient Name:   Nancy [facility] Date of Exam: 01/18/2021 Medical Rec #:  [id number]        Height:       63.0 in Accession #:    [id number]       Weight:       141.0 lb Date of Birth:  10/18/1940       BSA:          1.667 m Patient Age:    32 years         BP:           117/66 mmHg Patient Gender: F                HR:           115 bpm. Exam Location:  [facility] Procedure: 2D Echo, Cardiac Doppler and Color Doppler Indications:     Atrial Fibrillation I48.91  History:         Patient has no prior history of Echocardiogram examinations.                  Risk Factors:Hypertension.  Sonographer:     Sherrie Sport RDCS (AE) Referring Phys:  [id number] Loletha Grayer Diagnosing Phys: Yolonda Kida MD IMPRESSIONS  1. Mild Inferior Lateral Hypo.  2. Mild inferior lateral hypo. Left ventricular ejection fraction, by estimation, is 55 to 60%. The left ventricle  has normal function. The left ventricle demonstrates regional wall motion abnormalities (see scoring diagram/findings for description). Left ventricular diastolic parameters are consistent with Grade I diastolic dysfunction (impaired relaxation).  3. Right ventricular systolic function is normal. The right ventricular size is normal.  4. The mitral valve is abnormal. Mild to moderate mitral valve regurgitation.  5. Tricuspid valve regurgitation is moderate.  6. The aortic valve is normal in structure. Aortic valve regurgitation is mild. FINDINGS  Left Ventricle: Mild inferior lateral hypo. Left ventricular ejection fraction, by estimation, is 55 to 60%. The left ventricle has normal function. The left ventricle demonstrates regional wall motion abnormalities. The left ventricular internal cavity  size was normal in size. There is no left ventricular hypertrophy. Left ventricular diastolic parameters are consistent with  Grade I diastolic dysfunction (impaired relaxation). Right Ventricle: The right ventricular size is normal. No increase in right ventricular wall thickness. Right ventricular systolic function is normal. Left Atrium: Left atrial size was normal in size. Right Atrium: Right atrial size was normal in size. Pericardium: There is no evidence of pericardial effusion. Mitral Valve: The mitral valve is abnormal. There is mild thickening of the mitral valve leaflet(s). There is mild calcification of the mitral valve leaflet(s). Normal mobility of the mitral valve leaflets. Mild to moderate mitral valve regurgitation. Tricuspid Valve: The tricuspid valve is normal in structure. Tricuspid valve regurgitation is moderate. Aortic Valve: The aortic valve is normal in structure. Aortic valve regurgitation is mild. Aortic valve mean gradient measures 2.0 mmHg. Aortic valve peak gradient measures 4.1 mmHg. Aortic valve area, by VTI measures 1.72 cm. Pulmonic Valve: The pulmonic valve was normal in structure.  Pulmonic valve regurgitation is trivial. Aorta: The ascending aorta was not well visualized. IAS/Shunts: No atrial level shunt detected by color flow Doppler. Additional Comments: Mild Inferior Lateral Hypo.  LEFT VENTRICLE PLAX 2D LVIDd:         4.73 cm LVIDs:         3.13 cm LV PW:         1.03 cm LV IVS:        1.20 cm LVOT diam:     2.00 cm LV SV:         29 LV SV Index:   17 LVOT Area:     3.14 cm  RIGHT VENTRICLE RV S prime:     9.14 cm/s TAPSE (M-mode): 4.1 cm LEFT ATRIUM              Index       RIGHT ATRIUM           Index LA diam:        5.70 cm  3.42 cm/m  RA Area:     17.50 cm LA Vol (A2C):   95.7 ml  57.42 ml/m RA Volume:   46.00 ml  27.60 ml/m LA Vol (A4C):   106.0 ml 63.60 ml/m LA Biplane Vol: 104.0 ml 62.40 ml/m  AORTIC VALVE                   PULMONIC VALVE AV Area (Vmax):    1.38 cm    PV Vmax:        0.43 m/s AV Area (Vmean):   1.30 cm    PV Peak grad:   0.7 mmHg AV Area (VTI):     1.72 cm    RVOT Peak grad: 2 mmHg AV Vmax:           100.87 cm/s AV Vmean:          70.400 cm/s AV VTI:            0.168 m AV Peak Grad:      4.1 mmHg AV Mean Grad:      2.0 mmHg LVOT Vmax:         44.40 cm/s LVOT Vmean:        29.100 cm/s LVOT VTI:          0.092 m LVOT/AV VTI ratio: 0.55  AORTA Ao Root diam: 2.90 cm MITRAL VALVE                TRICUSPID VALVE MV Area (PHT): 5.36 cm     TR Peak grad:   33.2 mmHg MV Decel Time: 142 msec  TR Vmax:        288.00 cm/s MV E velocity: 119.50 cm/s                             SHUNTS                             Systemic VTI:  0.09 m                             Systemic Diam: 2.00 cm Dwayne Prince Rome MD Electronically signed by Yolonda Kida MD Signature Date/Time: 01/19/2021/8:51:49 AM    Final     (Echo, Carotid, EGD, Colonoscopy, ERCP)    Subjective: Seen and examined on the day of discharge.  Stable, no distress.  Is endorsing some mild shoulder pain otherwise stable.  No chest pain or palpitations.  Discharge Exam: Vitals:   01/20/21 0753  01/20/21 1222  BP: 130/80 121/70  Pulse: (!) 109 68  Resp: 16 16  Temp: 98.4 F (36.9 C) 98.6 F (37 C)  SpO2: 95% 94%   Vitals:   01/20/21 0426 01/20/21 0700 01/20/21 0753 01/20/21 1222  BP: 114/62  130/80 121/70  Pulse: 96  (!) 109 68  Resp: 18  16 16   Temp: 98.8 F (37.1 C)  98.4 F (36.9 C) 98.6 F (37 C)  TempSrc: Oral   Oral  SpO2: 93%  95% 94%  Weight:  68.7 kg    Height:        General: Pt is alert, awake, not in acute distress Cardiovascular: RRR, S1/S2 +, no rubs, no gallops Respiratory: CTA bilaterally, no wheezing, no rhonchi Abdominal: Soft, NT, ND, bowel sounds + Extremities: no edema, no cyanosis    The results of significant diagnostics from this hospitalization (including imaging, microbiology, ancillary and laboratory) are listed below for reference.     Microbiology: Recent Results (from the past 240 hour(s))  Resp Panel by RT-PCR (Flu A&B, Covid) Nasopharyngeal Swab     Status: None   Collection Time: 01/17/21  7:46 PM   Specimen: Nasopharyngeal Swab; Nasopharyngeal(NP) swabs in vial transport medium  Result Value Ref Range Status   SARS Coronavirus 2 by RT PCR NEGATIVE NEGATIVE Final    Comment: (NOTE) SARS-CoV-2 target nucleic acids are NOT DETECTED.  The SARS-CoV-2 RNA is generally detectable in upper respiratory specimens during the acute phase of infection. The lowest concentration of SARS-CoV-2 viral copies this assay can detect is 138 copies/mL. A negative result does not preclude SARS-Cov-2 infection and should not be used as the sole basis for treatment or other patient management decisions. A negative result may occur with  improper specimen collection/handling, submission of specimen other than nasopharyngeal swab, presence of viral mutation(s) within the areas targeted by this assay, and inadequate number of viral copies(<138 copies/mL). A negative result must be combined with clinical observations, patient history, and  epidemiological information. The expected result is Negative.  Fact Sheet for Patients:  EntrepreneurPulse.com.au  Fact Sheet for Healthcare Providers:  IncredibleEmployment.be  This test is no t yet approved or cleared by the [place] FDA and  has been authorized for detection and/or diagnosis of SARS-CoV-2 by FDA under an Emergency Use Authorization (EUA). This EUA will remain  in effect (meaning this test can be used) for the duration of the COVID-19 declaration under Section 564(b)(1) of the  Act, 21 U.S.C.section 360bbb-3(b)(1), unless the authorization is terminated  or revoked sooner.       Influenza A by PCR NEGATIVE NEGATIVE Final   Influenza B by PCR NEGATIVE NEGATIVE Final    Comment: (NOTE) The Xpert Xpress SARS-CoV-2/FLU/RSV plus assay is intended as an aid in the diagnosis of influenza from Nasopharyngeal swab specimens and should not be used as a sole basis for treatment. Nasal washings and aspirates are unacceptable for Xpert Xpress SARS-CoV-2/FLU/RSV testing.  Fact Sheet for Patients: EntrepreneurPulse.com.au  Fact Sheet for Healthcare Providers: IncredibleEmployment.be  This test is not yet approved or cleared by the [place] FDA and has been authorized for detection and/or diagnosis of SARS-CoV-2 by FDA under an Emergency Use Authorization (EUA). This EUA will remain in effect (meaning this test can be used) for the duration of the COVID-19 declaration under Section 564(b)(1) of the Act, 21 U.S.C. section 360bbb-3(b)(1), unless the authorization is terminated or revoked.  Performed at [facility], [address] [place], [place] [address]   Blood culture (routine single)     Status: None (Preliminary result)   Collection Time: 01/17/21 10:02 PM   Specimen: BLOOD  Result Value Ref Range Status   Specimen Description BLOOD RIGHT ANTECUBITAL  Final   Special  Requests   Final    BOTTLES DRAWN AEROBIC AND ANAEROBIC Blood Culture results may not be optimal due to an inadequate volume of blood received in culture bottles   Culture   Final    NO GROWTH 3 DAYS Performed at [facility], [address], [place], [place] [address]    Report Status PENDING  Incomplete  Urine culture     Status: Abnormal   Collection Time: 01/17/21 10:02 PM   Specimen: In/Out Cath Urine  Result Value Ref Range Status   Specimen Description   Final    IN/OUT CATH URINE Performed at [facility], [address] [place], [place] [address]    Special Requests   Final    NONE Performed at [facility], [address] [place], [place] [address]    Culture >=100,000 COLONIES/mL ENTEROCOCCUS FAECALIS (A)  Final   Report Status 01/20/2021 FINAL  Final   Organism ID, Bacteria ENTEROCOCCUS FAECALIS (A)  Final      Susceptibility   Enterococcus faecalis - MIC*    AMPICILLIN <=2 SENSITIVE Sensitive     NITROFURANTOIN <=16 SENSITIVE Sensitive     VANCOMYCIN 1 SENSITIVE Sensitive     * >=100,000 COLONIES/mL ENTEROCOCCUS FAECALIS     Labs: BNP (last 3 results) No results for input(s): BNP in the last 8760 hours. Basic Metabolic Panel: Recent Labs  Lab 01/17/21 1936 01/18/21 0406 01/19/21 0750 01/20/21 0442  NA 135  --  136 137  K 3.3*  --  4.4 4.1  CL 104  --  110 111  CO2 22  --  20* 20*  GLUCOSE 109*  --  117* 102*  BUN 14  --  18 14  CREATININE 0.69  --  0.79 0.74  CALCIUM 8.5*  --  8.0* 8.3*  MG 2.1 2.0 2.2 2.2  PHOS  --  3.2  --   --    Liver Function Tests: Recent Labs  Lab 01/18/21 0406  AST 35  ALT 42  ALKPHOS 94  BILITOT 0.9  PROT 5.7*  ALBUMIN 2.9*   No results for input(s): LIPASE, AMYLASE in the last 168 hours. No results for input(s): AMMONIA in the last 168  hours. CBC: Recent Labs  Lab 01/17/21 1936 01/18/21 0406 01/19/21 0750 01/20/21 0442  WBC 15.4* 11.0* 10.8* 10.9*  NEUTROABS  --   --  7.8*  7.5  HGB 11.1* 9.6* 10.2* 10.1*  HCT 33.3* 28.9* 31.2* 30.1*  MCV 86.9 88.7 88.9 87.8  PLT 242 206 223 237   Cardiac Enzymes: No results for input(s): CKTOTAL, CKMB, CKMBINDEX, TROPONINI in the last 168 hours. BNP: Invalid input(s): POCBNP CBG: No results for input(s): GLUCAP in the last 168 hours. D-Dimer Recent Labs    01/17/21 1936  DDIMER 0.54*   Hgb A1c No results for input(s): HGBA1C in the last 72 hours. Lipid Profile No results for input(s): CHOL, HDL, LDLCALC, TRIG, CHOLHDL, LDLDIRECT in the last 72 hours. Thyroid function studies Recent Labs    01/18/21 0406  TSH 4.311   Anemia work up Recent Labs    01/18/21 0406  VITAMINB12 1,510*  FOLATE 37.0  FERRITIN 113  TIBC 232*  IRON 28  RETICCTPCT 1.2   Urinalysis    Component Value Date/Time   COLORURINE YELLOW (A) 01/17/2021 2202   APPEARANCEUR CLEAR (A) 01/17/2021 2202   LABSPEC 1.008 01/17/2021 2202   PHURINE 7.0 01/17/2021 2202   GLUCOSEU NEGATIVE 01/17/2021 2202   HGBUR NEGATIVE 01/17/2021 2202   BILIRUBINUR NEGATIVE 01/17/2021 2202   KETONESUR NEGATIVE 01/17/2021 2202   PROTEINUR NEGATIVE 01/17/2021 2202   NITRITE NEGATIVE 01/17/2021 2202   LEUKOCYTESUR NEGATIVE 01/17/2021 2202   Sepsis Labs Invalid input(s): PROCALCITONIN,  WBC,  LACTICIDVEN Microbiology Recent Results (from the past 240 hour(s))  Resp Panel by RT-PCR (Flu A&B, Covid) Nasopharyngeal Swab     Status: None   Collection Time: 01/17/21  7:46 PM   Specimen: Nasopharyngeal Swab; Nasopharyngeal(NP) swabs in vial transport medium  Result Value Ref Range Status   SARS Coronavirus 2 by RT PCR NEGATIVE NEGATIVE Final    Comment: (NOTE) SARS-CoV-2 target nucleic acids are NOT DETECTED.  The SARS-CoV-2 RNA is generally detectable in upper respiratory specimens during the acute phase of infection. The lowest concentration of SARS-CoV-2 viral copies this assay can detect is 138 copies/mL. A negative result does not preclude  SARS-Cov-2 infection and should not be used as the sole basis for treatment or other patient management decisions. A negative result may occur with  improper specimen collection/handling, submission of specimen other than nasopharyngeal swab, presence of viral mutation(s) within the areas targeted by this assay, and inadequate number of viral copies(<138 copies/mL). A negative result must be combined with clinical observations, patient history, and epidemiological information. The expected result is Negative.  Fact Sheet for Patients:  BloggerCourse.com  Fact Sheet for Healthcare Providers:  SeriousBroker.it  This test is no t yet approved or cleared by the [place] FDA and  has been authorized for detection and/or diagnosis of SARS-CoV-2 by FDA under an Emergency Use Authorization (EUA). This EUA will remain  in effect (meaning this test can be used) for the duration of the COVID-19 declaration under Section 564(b)(1) of the Act, 21 U.S.C.section 360bbb-3(b)(1), unless the authorization is terminated  or revoked sooner.       Influenza A by PCR NEGATIVE NEGATIVE Final   Influenza B by PCR NEGATIVE NEGATIVE Final    Comment: (NOTE) The Xpert Xpress SARS-CoV-2/FLU/RSV plus assay is intended as an aid in the diagnosis of influenza from Nasopharyngeal swab specimens and should not be used as a sole basis for treatment. Nasal washings and aspirates are unacceptable for Xpert Xpress SARS-CoV-2/FLU/RSV testing.  Fact  Sheet for Patients: EntrepreneurPulse.com.au  Fact Sheet for Healthcare Providers: IncredibleEmployment.be  This test is not yet approved or cleared by the [place] FDA and has been authorized for detection and/or diagnosis of SARS-CoV-2 by FDA under an Emergency Use Authorization (EUA). This EUA will remain in effect (meaning this test can be used) for the duration of  the COVID-19 declaration under Section 564(b)(1) of the Act, 21 U.S.C. section 360bbb-3(b)(1), unless the authorization is terminated or revoked.  Performed at [facility], [address] [place], [place] [address]   Blood culture (routine single)     Status: None (Preliminary result)   Collection Time: 01/17/21 10:02 PM   Specimen: BLOOD  Result Value Ref Range Status   Specimen Description BLOOD RIGHT ANTECUBITAL  Final   Special Requests   Final    BOTTLES DRAWN AEROBIC AND ANAEROBIC Blood Culture results may not be optimal due to an inadequate volume of blood received in culture bottles   Culture   Final    NO GROWTH 3 DAYS Performed at [facility], [address], [place], [place] [address]    Report Status PENDING  Incomplete  Urine culture     Status: Abnormal   Collection Time: 01/17/21 10:02 PM   Specimen: In/Out Cath Urine  Result Value Ref Range Status   Specimen Description   Final    IN/OUT CATH URINE Performed at [facility], [address], [place], [place] [address]    Special Requests   Final    NONE Performed at [facility], [address] [place], [place] [address]    Culture >=100,000 COLONIES/mL ENTEROCOCCUS FAECALIS (A)  Final   Report Status 01/20/2021 FINAL  Final   Organism ID, Bacteria ENTEROCOCCUS FAECALIS (A)  Final      Susceptibility   Enterococcus faecalis - MIC*    AMPICILLIN <=2 SENSITIVE Sensitive     NITROFURANTOIN <=16 SENSITIVE Sensitive     VANCOMYCIN 1 SENSITIVE Sensitive     * >=100,000 COLONIES/mL ENTEROCOCCUS FAECALIS     Time coordinating discharge: Over 30 minutes  SIGNED:   Sidney Ace, MD  [facility] 01/20/2021, 1:24 PM Pager   If 7PM-7AM, please contact night-coverage

## 2021-01-20 NOTE — Progress Notes (Signed)
[facility] Cardiology    SUBJECTIVE: Patient states she feels reasonably well improved shortness of breath no significant palpitations or tachycardia no leg edema ambulating in the halls well states that she feels well enough to go home   Vitals:   01/19/21 1427 01/19/21 1921 01/20/21 0426 01/20/21 0753  BP: 116/66 114/62 114/62 130/80  Pulse: 81 (!) 101 96 (!) 109  Resp: 18 17 18 16   Temp: 97.6 F (36.4 C) 97.9 F (36.6 C) 98.8 F (37.1 C) 98.4 F (36.9 C)  TempSrc: Oral Oral Oral   SpO2: 94% 96% 93% 95%  Weight:      Height:         Intake/Output Summary (Last 24 hours) at 01/20/2021 0851 Last data filed at 01/19/2021 1811 Gross per 24 hour  Intake 720 ml  Output 925 ml  Net -205 ml      PHYSICAL EXAM  General: Well developed, well nourished, in no acute distress HEENT:  Normocephalic and atramatic Neck:  No JVD.  Lungs: Clear bilaterally to auscultation and percussion. Heart: Irregular irregular. Normal S1 and S2 without gallops or murmurs.  Abdomen: Bowel sounds are positive, abdomen soft and non-tender  Msk:  Back normal, normal gait. Normal strength and tone for age. Extremities: No clubbing, cyanosis or edema.   Neuro: Alert and oriented X 3. Psych:  Good affect, responds appropriately   LABS: Basic Metabolic Panel: Recent Labs    01/18/21 0406 01/19/21 0750 01/20/21 0442  NA  --  136 137  K  --  4.4 4.1  CL  --  110 111  CO2  --  20* 20*  GLUCOSE  --  117* 102*  BUN  --  18 14  CREATININE  --  0.79 0.74  CALCIUM  --  8.0* 8.3*  MG 2.0 2.2 2.2  PHOS 3.2  --   --    Liver Function Tests: Recent Labs    01/18/21 0406  AST 35  ALT 42  ALKPHOS 94  BILITOT 0.9  PROT 5.7*  ALBUMIN 2.9*   No results for input(s): LIPASE, AMYLASE in the last 72 hours. CBC: Recent Labs    01/19/21 0750 01/20/21 0442  WBC 10.8* 10.9*  NEUTROABS 7.8* 7.5  HGB 10.2* 10.1*  HCT 31.2* 30.1*  MCV 88.9 87.8  PLT 223 237   Cardiac Enzymes: No results for input(s):  CKTOTAL, CKMB, CKMBINDEX, TROPONINI in the last 72 hours. BNP: Invalid input(s): POCBNP D-Dimer: Recent Labs    01/17/21 1936  DDIMER 0.54*   Hemoglobin A1C: No results for input(s): HGBA1C in the last 72 hours. Fasting Lipid Panel: No results for input(s): CHOL, HDL, LDLCALC, TRIG, CHOLHDL, LDLDIRECT in the last 72 hours. Thyroid Function Tests: Recent Labs    01/18/21 0406  TSH 4.311   Anemia Panel: Recent Labs    01/18/21 0406  VITAMINB12 1,510*  FOLATE 37.0  FERRITIN 113  TIBC 232*  IRON 28  RETICCTPCT 1.2    ECHOCARDIOGRAM COMPLETE  Result Date: 01/19/2021    ECHOCARDIOGRAM REPORT   Patient Name:   [facility] Date of Exam: 01/18/2021 Medical Rec #:  [id number]        Height:       63.0 in Accession #:    [id number]       Weight:       141.0 lb Date of Birth:  26-Jun-1941       BSA:          1.667 m Patient Age:  79 years         BP:           117/66 mmHg Patient Gender: F                HR:           115 bpm. Exam Location:  [facility] Procedure: 2D Echo, Cardiac Doppler and Color Doppler Indications:     Atrial Fibrillation I48.91  History:         Patient has no prior history of Echocardiogram examinations.                  Risk Factors:Hypertension.  Sonographer:     Sherrie Sport RDCS (AE) Referring Phys:  [id number] Loletha Grayer Diagnosing Phys: Yolonda Kida MD IMPRESSIONS  1. Mild Inferior Lateral Hypo.  2. Mild inferior lateral hypo. Left ventricular ejection fraction, by estimation, is 55 to 60%. The left ventricle has normal function. The left ventricle demonstrates regional wall motion abnormalities (see scoring diagram/findings for description). Left ventricular diastolic parameters are consistent with Grade I diastolic dysfunction (impaired relaxation).  3. Right ventricular systolic function is normal. The right ventricular size is normal.  4. The mitral valve is abnormal. Mild to moderate mitral valve regurgitation.  5. Tricuspid valve regurgitation is moderate.  6.  The aortic valve is normal in structure. Aortic valve regurgitation is mild. FINDINGS  Left Ventricle: Mild inferior lateral hypo. Left ventricular ejection fraction, by estimation, is 55 to 60%. The left ventricle has normal function. The left ventricle demonstrates regional wall motion abnormalities. The left ventricular internal cavity  size was normal in size. There is no left ventricular hypertrophy. Left ventricular diastolic parameters are consistent with Grade I diastolic dysfunction (impaired relaxation). Right Ventricle: The right ventricular size is normal. No increase in right ventricular wall thickness. Right ventricular systolic function is normal. Left Atrium: Left atrial size was normal in size. Right Atrium: Right atrial size was normal in size. Pericardium: There is no evidence of pericardial effusion. Mitral Valve: The mitral valve is abnormal. There is mild thickening of the mitral valve leaflet(s). There is mild calcification of the mitral valve leaflet(s). Normal mobility of the mitral valve leaflets. Mild to moderate mitral valve regurgitation. Tricuspid Valve: The tricuspid valve is normal in structure. Tricuspid valve regurgitation is moderate. Aortic Valve: The aortic valve is normal in structure. Aortic valve regurgitation is mild. Aortic valve mean gradient measures 2.0 mmHg. Aortic valve peak gradient measures 4.1 mmHg. Aortic valve area, by VTI measures 1.72 cm. Pulmonic Valve: The pulmonic valve was normal in structure. Pulmonic valve regurgitation is trivial. Aorta: The ascending aorta was not well visualized. IAS/Shunts: No atrial level shunt detected by color flow Doppler. Additional Comments: Mild Inferior Lateral Hypo.  LEFT VENTRICLE PLAX 2D LVIDd:         4.73 cm LVIDs:         3.13 cm LV PW:         1.03 cm LV IVS:        1.20 cm LVOT diam:     2.00 cm LV SV:         29 LV SV Index:   17 LVOT Area:     3.14 cm  RIGHT VENTRICLE RV S prime:     9.14 cm/s TAPSE (M-mode): 4.1 cm  LEFT ATRIUM              Index       RIGHT ATRIUM  Index LA diam:        5.70 cm  3.42 cm/m  RA Area:     17.50 cm LA Vol (A2C):   95.7 ml  57.42 ml/m RA Volume:   46.00 ml  27.60 ml/m LA Vol (A4C):   106.0 ml 63.60 ml/m LA Biplane Vol: 104.0 ml 62.40 ml/m  AORTIC VALVE                   PULMONIC VALVE AV Area (Vmax):    1.38 cm    PV Vmax:        0.43 m/s AV Area (Vmean):   1.30 cm    PV Peak grad:   0.7 mmHg AV Area (VTI):     1.72 cm    RVOT Peak grad: 2 mmHg AV Vmax:           100.87 cm/s AV Vmean:          70.400 cm/s AV VTI:            0.168 m AV Peak Grad:      4.1 mmHg AV Mean Grad:      2.0 mmHg LVOT Vmax:         44.40 cm/s LVOT Vmean:        29.100 cm/s LVOT VTI:          0.092 m LVOT/AV VTI ratio: 0.55  AORTA Ao Root diam: 2.90 cm MITRAL VALVE                TRICUSPID VALVE MV Area (PHT): 5.36 cm     TR Peak grad:   33.2 mmHg MV Decel Time: 142 msec     TR Vmax:        288.00 cm/s MV E velocity: 119.50 cm/s                             SHUNTS                             Systemic VTI:  0.09 m                             Systemic Diam: 2.00 cm Kieara Schwark D Dalbert Stillings MD Electronically signed by Yolonda Kida MD Signature Date/Time: 01/19/2021/8:51:49 AM    Final      Echo preserved overall left ventricular function of around 55%  TELEMETRY: Atrial fibrillation rate of about 80 nonspecific ST-T wave changes  ASSESSMENT AND PLAN:  Active Problems:   Atrial fibrillation with RVR (HCC)   Sepsis (HCC)   CAP (community acquired pneumonia)   Essential hypertension   Anemia    Plan Patient states she feels much improved reduced shortness of breath or dyspnea Still irregular heartbeat minimal palpitations rate much improved No significant sputum production with cough antibiotics have been switched to p.o. Hypertension reasonably managed Continue increased activity ambulation in the halls   Yolonda Kida, MD 01/20/2021 8:51 AM

## 2021-01-20 NOTE — Progress Notes (Signed)
Patient discharged per orders, PIVs/tele removed from from patient. Discharged instructions reviewed; pt agreeable. Taken down to vehicle by volunteer via wheelchair.

## 2021-01-20 NOTE — Discharge Instructions (Signed)
Atrial Fibrillation  Atrial fibrillation is a type of heartbeat that is irregular or fast. If you have this condition, your heart beats without any order. This makes it hard for your heart to pump blood in a normal way. Atrial fibrillation may come and go, or it may become a long-lasting problem. If this condition is not treated, it can put you at higher risk for stroke, heart failure, and other heart problems. What are the causes? This condition may be caused by diseases that damage the heart. They include:  High blood pressure.  Heart failure.  Heart valve disease.  Heart surgery. Other causes include:  Diabetes.  Thyroid disease.  Being overweight.  Kidney disease. Sometimes the cause is not known. What increases the risk? You are more likely to develop this condition if:  You are older.  You smoke.  You exercise often and very hard.  You have a family history of this condition.  You are a man.  You use drugs.  You drink a lot of alcohol.  You have lung conditions, such as emphysema, pneumonia, or COPD.  You have sleep apnea. What are the signs or symptoms? Common symptoms of this condition include:  A feeling that your heart is beating very fast.  Chest pain or discomfort.  Feeling short of breath.  Suddenly feeling light-headed or weak.  Getting tired easily during activity.  Fainting.  Sweating. In some cases, there are no symptoms. How is this treated? Treatment for this condition depends on underlying conditions and how you feel when you have atrial fibrillation. They include:  Medicines to: ? Prevent blood clots. ? Treat heart rate or heart rhythm problems.  Using devices, such as a pacemaker, to correct heart rhythm problems.  Doing surgery to remove the part of the heart that sends bad signals.  Closing an area where clots can form in the heart (left atrial appendage). In some cases, your doctor will treat other underlying  conditions. Follow these instructions at home: Medicines  Take over-the-counter and prescription medicines only as told by your doctor.  Do not take any new medicines without first talking to your doctor.  If you are taking blood thinners: ? Talk with your doctor before you take any medicines that have aspirin or NSAIDs, such as ibuprofen, in them. ? Take your medicine exactly as told by your doctor. Take it at the same time each day. ? Avoid activities that could hurt or bruise you. Follow instructions about how to prevent falls. ? Wear a bracelet that says you are taking blood thinners. Or, carry a card that lists what medicines you take. Lifestyle  Do not use any products that have nicotine or tobacco in them. These include cigarettes, e-cigarettes, and chewing tobacco. If you need help quitting, ask your doctor.  Eat heart-healthy foods. Talk with your doctor about the right eating plan for you.  Exercise regularly as told by your doctor.  Do not drink alcohol.  Lose weight if you are overweight.  Do not use drugs, including cannabis.      General instructions  If you have a condition that causes breathing to stop for a short period of time (apnea), treat it as told by your doctor.  Keep a healthy weight. Do not use diet pills unless your doctor says they are safe for you. Diet pills may make heart problems worse.  Keep all follow-up visits as told by your doctor. This is important. Contact a doctor if:  You notice a   change in the speed, rhythm, or strength of your heartbeat.  You are taking a blood-thinning medicine and you get more bruising.  You get tired more easily when you move or exercise.  You have a sudden change in weight. Get help right away if:  You have pain in your chest or your belly (abdomen).  You have trouble breathing.  You have side effects of blood thinners, such as blood in your vomit, poop (stool), or pee (urine), or bleeding that cannot  stop.  You have any signs of a stroke. "BE FAST" is an easy way to remember the main warning signs: ? B - Balance. Signs are dizziness, sudden trouble walking, or loss of balance. ? E - Eyes. Signs are trouble seeing or a change in how you see. ? F - Face. Signs are sudden weakness or loss of feeling in the face, or the face or eyelid drooping on one side. ? A - Arms. Signs are weakness or loss of feeling in an arm. This happens suddenly and usually on one side of the body. ? S - Speech. Signs are sudden trouble speaking, slurred speech, or trouble understanding what people say. ? T - Time. Time to call emergency services. Write down what time symptoms started.  You have other signs of a stroke, such as: ? A sudden, very bad headache with no known cause. ? Feeling like you may vomit (nausea). ? Vomiting. ? A seizure. These symptoms may be an emergency. Do not wait to see if the symptoms will go away. Get medical help right away. Call your local emergency services (911 in the [place] Do not drive yourself to the hospital.   Summary  Atrial fibrillation is a type of heartbeat that is irregular or fast.  You are at higher risk of this condition if you smoke, are older, have diabetes, or are overweight.  Follow your doctor's instructions about medicines, diet, exercise, and follow-up visits.  Get help right away if you have signs or symptoms of a stroke.  Get help right away if you cannot catch your breath, or you have chest pain or discomfort. This information is not intended to replace advice given to you by your health care provider. Make sure you discuss any questions you have with your health care provider. Document Revised: 02/26/2019 Document Reviewed: 02/26/2019 Elsevier Patient Education  [place] Pneumonia, Adult Pneumonia is an infection of the lungs. It causes irritation and swelling in the airways of the lungs. Mucus and fluid may also build  up inside the airways. This may cause coughing and trouble breathing. One type of pneumonia can happen while you are in a hospital. A different type can happen when you are not in a hospital (community-acquired pneumonia). What are the causes? This condition is caused by germs (viruses, bacteria, or fungi). Some types of germs can spread from person to person. Pneumonia is not thought to spread from person to person.   What increases the risk? You are more likely to develop this condition if:  You have a long-term (chronic) disease, such as: ? Disease of the lungs. This may be chronic obstructive pulmonary disease (COPD) or asthma. ? Heart failure. ? Cystic fibrosis. ? Diabetes. ? Kidney disease. ? Sickle cell disease. ? HIV.  You have other health problems, such as: ? Your body's defense system (immune system) is weak. ? A condition that may cause you to breathe in fluids from your mouth and nose.  You had your spleen taken out.  You do not take good care of your teeth and mouth (poor dental hygiene).  You use or have used tobacco products.  You travel where the germs that cause this illness are common.  You are near certain animals or the places they live.  You are older than 80 years of age. What are the signs or symptoms? Symptoms of this condition include:  A cough.  A fever.  Sweating or chills.  Chest pain, often when you breathe deeply or cough.  Breathing problems, such as: ? Fast breathing. ? Trouble breathing. ? Shortness of breath.  Feeling tired (fatigued).  Muscle aches. How is this treated? Treatment for this condition depends on many things, such as:  The cause of your illness.  Your medicines.  Your other health problems. Most adults can be treated at home. Sometimes, treatment must happen in a hospital.  Treatment may include medicines to kill germs.  Medicines may depend on which germ caused your illness. Very bad pneumonia is rare. If  you get it, you may:  Have a machine to help you breathe.  Have fluid taken away from around your lungs. Follow these instructions at home: Medicines  Take over-the-counter and prescription medicines only as told by your doctor.  Take cough medicine only if you are losing sleep. Cough medicine can keep your body from taking mucus away from your lungs.  If you were prescribed an antibiotic medicine, take it as told by your doctor. Do not stop taking the antibiotic even if you start to feel better. Lifestyle  Do not drink alcohol.  Do not use any products that contain nicotine or tobacco, such as cigarettes, e-cigarettes, and chewing tobacco. If you need help quitting, ask your doctor.  Eat a healthy diet. This includes a lot of vegetables, fruits, whole grains, low-fat dairy products, and low-fat (lean) protein.      General instructions  Rest a lot. Sleep for at least 8 hours each night.  Sleep with your head and neck raised. Put a few pillows under your head or sleep in a reclining chair.  Return to your normal activities as told by your doctor. Ask your doctor what activities are safe for you.  Drink enough fluid to keep your pee (urine) pale yellow.  If your throat is sore, rinse your mouth often with salt water. To make salt water, dissolve -1 tsp (3-6 g) of salt in 1 cup (237 mL) of warm water.  Keep all follow-up visits as told by your doctor. This is important.   How is this prevented? You can lower your risk of pneumonia by:  Getting the pneumonia shot (vaccine). These shots have different types and schedules. Ask your doctor what works best for you. Think about getting this shot if: ? You are older than 80 years of age. ? You are 71-20 years of age and:  You are being treated for cancer.  You have long-term lung disease.  You have other problems that affect your body's defense system. Ask your doctor if you have one of these.  Getting your flu shot every  year. Ask your doctor which type of shot is best for you.  Going to the dentist as often as told.  Washing your hands often with soap and water for at least 20 seconds. If you cannot use soap and water, use hand sanitizer. Contact a doctor if:  You have a fever.  You lose sleep because your cough  medicine does not help. Get help right away if:  You are short of breath and this gets worse.  You have more chest pain.  Your sickness gets worse. This is very serious if: ? You are an older adult. ? Your body's defense system is weak.  You cough up blood. These symptoms may be an emergency. Do not wait to see if the symptoms will go away. Get medical help right away. Call your local emergency services (911 in the [place] Do not drive yourself to the hospital. Summary  Pneumonia is an infection of the lungs.  Community-acquired pneumonia affects people who have not been in the hospital. Certain germs can cause this infection.  This condition may be treated with medicines that kill germs.  For very bad pneumonia, you may need a hospital stay and treatment to help with breathing. This information is not intended to replace advice given to you by your health care provider. Make sure you discuss any questions you have with your health care provider. Document Revised: 06/17/2019 Document Reviewed: 06/17/2019 Elsevier Patient Education  [place].

## 2021-01-20 NOTE — Care Management Important Message (Signed)
Important Message  Patient Details  Name: Nancy Lopez MRN: [id number] Date of Birth: [birth year]   Medicare Important Message Given:  Yes     Dannette Barbara 01/20/2021, 2:07 PM

## 2021-01-22 LAB — CULTURE, BLOOD (SINGLE): Culture: NO GROWTH

## 2021-01-25 DIAGNOSIS — D649 Anemia, unspecified: Secondary | ICD-10-CM | POA: Diagnosis not present

## 2021-01-25 DIAGNOSIS — E876 Hypokalemia: Secondary | ICD-10-CM | POA: Diagnosis not present

## 2021-01-25 DIAGNOSIS — I4891 Unspecified atrial fibrillation: Secondary | ICD-10-CM | POA: Diagnosis not present

## 2021-01-25 DIAGNOSIS — R0602 Shortness of breath: Secondary | ICD-10-CM | POA: Diagnosis not present

## 2021-01-25 DIAGNOSIS — R609 Edema, unspecified: Secondary | ICD-10-CM | POA: Diagnosis not present

## 2021-01-25 DIAGNOSIS — I10 Essential (primary) hypertension: Secondary | ICD-10-CM | POA: Diagnosis not present

## 2021-02-04 ENCOUNTER — Ambulatory Visit: Payer: PPO | Admitting: Cardiology

## 2021-02-04 ENCOUNTER — Other Ambulatory Visit: Payer: Self-pay

## 2021-02-04 ENCOUNTER — Encounter: Payer: Self-pay | Admitting: Cardiology

## 2021-02-04 VITALS — BP 124/68 | HR 89 | Ht 63.0 in | Wt 147.0 lb

## 2021-02-04 DIAGNOSIS — I4819 Other persistent atrial fibrillation: Secondary | ICD-10-CM | POA: Diagnosis not present

## 2021-02-04 DIAGNOSIS — I10 Essential (primary) hypertension: Secondary | ICD-10-CM

## 2021-02-04 DIAGNOSIS — R600 Localized edema: Secondary | ICD-10-CM

## 2021-02-04 NOTE — Progress Notes (Signed)
Cardiology Office Note:    Date:  02/04/2021   ID:  ERNA Lopez, DOB 1941-02-25, MRN [id number]  PCP:  Maryland Pink, MD   [facility] Providers Cardiologist:  Nelva Bush, MD     Referring MD: Maryland Pink, MD   Chief Complaint  Patient presents with  . New Patient (Initial Visit)    Referred by PCP for New onset afib. Patient c.o swelling in ankles and SOB.  Meds reviewed verbally with patient.    Nancy Lopez is a 80 y.o. female who is being seen today for the evaluation of atrial fibrillation at the request of Maryland Pink, MD.   History of Present Illness:    Nancy Lopez is a 80 y.o. female with a hx of hypertension, GERD presenting due to new onset atrial fibrillation.  Presented to the hospital 01/17/2021 with symptoms of chest pain.  Troponins were normal, found to have atrial fibrillation with rapid ventricular response.  Was also diagnosed with pneumonia managed with antibiotics.  Rate controlled with Cardizem and metoprolol, started on Eliquis for stroke risk reduction.  Previous antihypertensives were stopped.  Patient continued on Cardizem and metoprolol.  She notices edema since hospital discharge.  Follow-up with primary care provider 3 days ago, Lasix 20 mg daily was started with good effect.  She otherwise feels well, denies palpitations, dizziness, chest pain, shortness of breath.  Tolerating current medications without any adverse effects.  Echocardiogram 01/18/2021 showed normal systolic function, EF 55 to 60%, severely dilated left atrium, mild to moderate MR.  Past Medical History:  Diagnosis Date  . Arthritis    right knee  . Diverticulosis   . GERD (gastroesophageal reflux disease)   . Hypertension   . Osteopenia   . Personal history of colonic polyps     Past Surgical History:  Procedure Laterality Date  . ABDOMINAL HYSTERECTOMY  1979  . BREAST CYST ASPIRATION    . CHOLECYSTECTOMY    . COLONOSCOPY WITH PROPOFOL N/A  09/06/2015   Procedure: COLONOSCOPY WITH PROPOFOL;  Surgeon: Hulen Luster, MD;  Location: [facility] ENDOSCOPY;  Service: Gastroenterology;  Laterality: N/A;  . COLONOSCOPY WITH PROPOFOL N/A 11/10/2019   Procedure: COLONOSCOPY WITH PROPOFOL;  Surgeon: Toledo, Benay Pike, MD;  Location: [facility] ENDOSCOPY;  Service: Gastroenterology;  Laterality: N/A;  . EXCISION METACARPAL MASS Right 08/31/2016   Procedure: EXCISION METACARPAL MASS;  Surgeon: Hessie Knows, MD;  Location: [facility] ORS;  Service: Orthopedics;  Laterality: Right;  . HERNIA REPAIR      Current Medications: Current Meds  Medication Sig  . apixaban (ELIQUIS) 5 MG TABS tablet Take 1 tablet (5 mg total) by mouth 2 (two) times daily.  Marland Kitchen diltiazem (CARDIZEM CD) 180 MG 24 hr capsule Take 1 capsule (180 mg total) by mouth daily.  . furosemide (LASIX) 20 MG tablet Take 20 mg by mouth daily.  Marland Kitchen KLOR-CON M10 10 MEQ tablet Take 10 mEq by mouth daily.  . metoprolol succinate (TOPROL-XL) 100 MG 24 hr tablet Take 1 tablet (100 mg total) by mouth daily. Take with or immediately following a meal.     Allergies:   Patient has no known allergies.   Social History   Socioeconomic History  . Marital status: Widowed    Spouse name: Not on file  . Number of children: Not on file  . Years of education: Not on file  . Highest education level: Not on file  Occupational History  . Not on file  Tobacco Use  . Smoking  status: Never Smoker  . Smokeless tobacco: Never Used  Vaping Use  . Vaping Use: Never used  Substance and Sexual Activity  . Alcohol use: No  . Drug use: No  . Sexual activity: Never  Other Topics Concern  . Not on file  Social History Narrative  . Not on file   Social Determinants of Health   Financial Resource Strain: Not on file  Food Insecurity: Not on file  Transportation Needs: Not on file  Physical Activity: Not on file  Stress: Not on file  Social Connections: Not on file     Family History: The patient's family history is  negative for Breast cancer.  ROS:   Please see the history of present illness.     All other systems reviewed and are negative.  EKGs/Labs/Other Studies Reviewed:    The following studies were reviewed today:   EKG:  EKG is  ordered today.  The ekg ordered today demonstrates atrial fibrillation, heart rate 89  Recent Labs: 01/18/2021: ALT 42; TSH 4.311 01/20/2021: BUN 14; Creatinine, Ser 0.74; Hemoglobin 10.1; Magnesium 2.2; Platelets 237; Potassium 4.1; Sodium 137  Recent Lipid Panel No results found for: CHOL, TRIG, HDL, CHOLHDL, VLDL, LDLCALC, LDLDIRECT   Risk Assessment/Calculations:      Physical Exam:    VS:  BP 124/68 (BP Location: Left Arm, Patient Position: Sitting, Cuff Size: Normal)   Pulse 89   Ht 5\' 3"  (1.6 m)   Wt 147 lb (66.7 kg)   SpO2 96%   BMI 26.04 kg/m     Wt Readings from Last 3 Encounters:  02/04/21 147 lb (66.7 kg)  01/20/21 151 lb 7.3 oz (68.7 kg)  11/10/19 128 lb (58.1 kg)     GEN:  Well nourished, well developed in no acute distress HEENT: Normal NECK: No JVD; No carotid bruits LYMPHATICS: No lymphadenopathy CARDIAC: Irregularly irregular, nontachycardic. RESPIRATORY:  Clear to auscultation without rales, wheezing or rhonchi  ABDOMEN: Soft, non-tender, non-distended MUSCULOSKELETAL:  1+ edema; No deformity  SKIN: Warm and dry NEUROLOGIC:  Alert and oriented x 3 PSYCHIATRIC:  Normal affect   ASSESSMENT:    1. Persistent atrial fibrillation ([place])   2. Primary hypertension   3. Edema of both legs    PLAN:    In order of problems listed above:  1. Persistent atrial fibrillation, CHA2DS2-VASc score 4 (age, htn, gender).  Heart rate controlled.  Echocardiogram with preserved ejection fraction, severely dilated left atrium.  Patient is asymptomatic.  Continue Toprol-XL, Cardizem CD.  Eliquis 5 mg twice daily.  Rate control strategy will be pursued.  Patient with severely dilated left atrium, low chance of success if DC cardioversion  pursued, also she is asymptomatic. 2. Hypertension, BP controlled.  Continue Cardizem, Toprol-XL 3. Bilateral lower extremity edema, likely secondary to diastolic dysfunction.  Lasix started 2 days ago with good effect.  Continue Lasix 20 mg daily with potassium supplements.  Follow-up in 6 months.     Medication Adjustments/Labs and Tests Ordered: Current medicines are reviewed at length with the patient today.  Concerns regarding medicines are outlined above.  Orders Placed This Encounter  Procedures  . EKG 12-Lead   No orders of the defined types were placed in this encounter.   Patient Instructions  Medication Instructions:   Your physician recommends that you continue on your current medications as directed. Please refer to the Current Medication list given to you today.   *If you need a refill on your cardiac medications before your next  appointment, please call your pharmacy*   Lab Work: None ordered If you have labs (blood work) drawn today and your tests are completely normal, you will receive your results only by: Marland Kitchen MyChart Message (if you have MyChart) OR . A paper copy in the mail If you have any lab test that is abnormal or we need to change your treatment, we will call you to review the results.   Testing/Procedures: None ordered   Follow-Up: At [facility], you and your health needs are our priority.  As part of our continuing mission to provide you with exceptional heart care, we have created designated Provider Care Teams.  These Care Teams include your primary Cardiologist (physician) and Advanced Practice Providers (APPs -  Physician Assistants and Nurse Practitioners) who all work together to provide you with the care you need, when you need it.  We recommend signing up for the patient portal called "MyChart".  Sign up information is provided on this After Visit Summary.  MyChart is used to connect with patients for Virtual Visits (Telemedicine).   Patients are able to view lab/test results, encounter notes, upcoming appointments, etc.  Non-urgent messages can be sent to your provider as well.   To learn more about what you can do with MyChart, go to [email or web address].    Your next appointment:   6 month(s)  The format for your next appointment:   In Person  Provider:   You may see Nelva Bush, MD or one of the following Advanced Practice Providers on your designated Care Team:     Murray Hodgkins, NP  Christell Faith, PA-C  Marrianne Mood, PA-C  Cadence [employer], [place]  Laurann Montana, NP    Other Instructions      Signed, Kate Sable, MD  02/04/2021 1:37 PM    [place]

## 2021-02-04 NOTE — Patient Instructions (Signed)
Medication Instructions:   Your physician recommends that you continue on your current medications as directed. Please refer to the Current Medication list given to you today.   *If you need a refill on your cardiac medications before your next appointment, please call your pharmacy*   Lab Work: None ordered If you have labs (blood work) drawn today and your tests are completely normal, you will receive your results only by: Marland Kitchen MyChart Message (if you have MyChart) OR . A paper copy in the mail If you have any lab test that is abnormal or we need to change your treatment, we will call you to review the results.   Testing/Procedures: None ordered   Follow-Up: At [facility], you and your health needs are our priority.  As part of our continuing mission to provide you with exceptional heart care, we have created designated Provider Care Teams.  These Care Teams include your primary Cardiologist (physician) and Advanced Practice Providers (APPs -  Physician Assistants and Nurse Practitioners) who all work together to provide you with the care you need, when you need it.  We recommend signing up for the patient portal called "MyChart".  Sign up information is provided on this After Visit Summary.  MyChart is used to connect with patients for Virtual Visits (Telemedicine).  Patients are able to view lab/test results, encounter notes, upcoming appointments, etc.  Non-urgent messages can be sent to your provider as well.   To learn more about what you can do with MyChart, go to [email or web address].    Your next appointment:   6 month(s)  The format for your next appointment:   In Person  Provider:   You may see Nelva Bush, MD or one of the following Advanced Practice Providers on your designated Care Team:     Murray Hodgkins, NP  Christell Faith, PA-C  Marrianne Mood, PA-C  Cadence [place], [place]  Laurann Montana, NP    Other Instructions

## 2021-02-04 NOTE — Progress Notes (Deleted)
New Outpatient Visit Date: 02/04/2021  Referring Provider: Maryland Pink, MD [address] [place],  [place] [address]  Chief Complaint: ***  HPI:  Nancy Lopez is a 80 y.o. female who is being seen today for the evaluation of atrial fibrillation at the request of Dr. Kary Kos. She has a history of recently diagnosed atrial fibrillation, hypertension, colon polyps, diverticulosis, and osteopenia.  She was hospitalized at [facility] earlier this month with chest pain and was found to be in new atrial fibrillation with rapid ventricular response in the setting of community acquired pneumonia and suspected sepsis.  Dr. Clayborn Bigness consulted on the patient.  She was treated with metoprolol and diltiazem for rate control as well as antimicrobial therapy.  She was discharged on metoprolol succinate, diltiazem, and apixaban.  --------------------------------------------------------------------------------------------------  Cardiovascular History & Procedures: Cardiovascular Problems:  Atrial fibrillation  Mitral/tricuspid regurgitation  Risk Factors:  Hypertension and age greater than 8  Cath/PCI:  None  CV Surgery:  None  EP Procedures and Devices:  None  Non-Invasive Evaluation(s):  TTE (01/18/2021): Normal LV size and wall thickness.  LVEF 55-60% with mild inferolateral hypokinesis.  Grade 1 diastolic dysfunction.  Normal RV size and function.  Normal biatrial size.  Mild to moderate mitral and moderate tricuspid regurgitation.  (On my review, LVEF 55 to 60% with grossly normal wall motion.  Diastolic function cannot be assessed due to atrial fibrillation.  Mitral valve is thickened and degenerative in appearance with at least moderate, eccentric regurgitation.  There is severe enlargement of the left atrium).  Recent CV Pertinent Labs: Lab Results  Component Value Date   INR 1.2 01/17/2021   K 4.1 01/20/2021   MG 2.2 01/20/2021   BUN 14 01/20/2021   CREATININE  0.74 01/20/2021    --------------------------------------------------------------------------------------------------  Past Medical History:  Diagnosis Date  . Arthritis    right knee  . Diverticulosis   . GERD (gastroesophageal reflux disease)   . Hypertension   . Osteopenia   . Personal history of colonic polyps     Past Surgical History:  Procedure Laterality Date  . ABDOMINAL HYSTERECTOMY  1979  . BREAST CYST ASPIRATION    . CHOLECYSTECTOMY    . COLONOSCOPY WITH PROPOFOL N/A 09/06/2015   Procedure: COLONOSCOPY WITH PROPOFOL;  Surgeon: Hulen Luster, MD;  Location: [facility] ENDOSCOPY;  Service: Gastroenterology;  Laterality: N/A;  . COLONOSCOPY WITH PROPOFOL N/A 11/10/2019   Procedure: COLONOSCOPY WITH PROPOFOL;  Surgeon: Toledo, Benay Pike, MD;  Location: [facility] ENDOSCOPY;  Service: Gastroenterology;  Laterality: N/A;  . EXCISION METACARPAL MASS Right 08/31/2016   Procedure: EXCISION METACARPAL MASS;  Surgeon: Hessie Knows, MD;  Location: [facility] ORS;  Service: Orthopedics;  Laterality: Right;  . HERNIA REPAIR      No outpatient medications have been marked as taking for the 02/04/21 encounter (Appointment) with Anthea Udovich, Harrell Gave, MD.    Allergies: Patient has no known allergies.  Social History   Tobacco Use  . Smoking status: Never Smoker  . Smokeless tobacco: Never Used  Vaping Use  . Vaping Use: Never used  Substance Use Topics  . Alcohol use: No  . Drug use: No    Family History  Problem Relation Age of Onset  . Breast cancer Neg Hx     Review of Systems: A 12-system review of systems was performed and was negative except as noted in the HPI.  --------------------------------------------------------------------------------------------------  Physical Exam: There were no vitals taken for this visit.  General:  *** HEENT:  No conjunctival pallor or scleral icterus. Facemask in place. Neck: Supple without lymphadenopathy, thyromegaly, JVD, or HJR. No carotid  bruit. Lungs: Normal work of breathing. Clear to auscultation bilaterally without wheezes or crackles. Heart: Regular rate and rhythm without murmurs, rubs, or gallops. Non-displaced PMI. Abd: Bowel sounds present. Soft, NT/ND without hepatosplenomegaly Ext: No lower extremity edema. Radial, PT, and DP pulses are 2+ bilaterally Skin: Warm and dry without rash. Neuro: CNIII-XII intact. Strength and fine-touch sensation intact in upper and lower extremities bilaterally. Psych: Normal mood and affect.  EKG:  ***  Lab Results  Component Value Date   WBC 10.9 (H) 01/20/2021   HGB 10.1 (L) 01/20/2021   HCT 30.1 (L) 01/20/2021   MCV 87.8 01/20/2021   PLT 237 01/20/2021    Lab Results  Component Value Date   NA 137 01/20/2021   K 4.1 01/20/2021   CL 111 01/20/2021   CO2 20 (L) 01/20/2021   BUN 14 01/20/2021   CREATININE 0.74 01/20/2021   GLUCOSE 102 (H) 01/20/2021   ALT 42 01/18/2021    No results found for: CHOL, HDL, LDLCALC, LDLDIRECT, TRIG, CHOLHDL   --------------------------------------------------------------------------------------------------  ASSESSMENT AND PLAN: ***  Nelva Bush, MD 02/04/2021 7:02 AM

## 2021-03-07 DIAGNOSIS — R0602 Shortness of breath: Secondary | ICD-10-CM | POA: Diagnosis not present

## 2021-03-07 DIAGNOSIS — I4819 Other persistent atrial fibrillation: Secondary | ICD-10-CM | POA: Diagnosis not present

## 2021-03-07 DIAGNOSIS — J189 Pneumonia, unspecified organism: Secondary | ICD-10-CM | POA: Diagnosis not present

## 2021-03-07 DIAGNOSIS — Z8701 Personal history of pneumonia (recurrent): Secondary | ICD-10-CM | POA: Diagnosis not present

## 2021-03-07 DIAGNOSIS — I10 Essential (primary) hypertension: Secondary | ICD-10-CM | POA: Diagnosis not present

## 2021-03-07 DIAGNOSIS — R0781 Pleurodynia: Secondary | ICD-10-CM | POA: Diagnosis not present

## 2021-03-08 ENCOUNTER — Telehealth: Payer: Self-pay | Admitting: Cardiology

## 2021-03-08 ENCOUNTER — Other Ambulatory Visit: Payer: Self-pay

## 2021-03-08 MED ORDER — METOPROLOL SUCCINATE ER 100 MG PO TB24: 100.0000 mg | ORAL_TABLET | Freq: Every day | ORAL | 0 refills | Status: DC

## 2021-03-08 MED ORDER — DILTIAZEM HCL ER COATED BEADS 180 MG PO CP24: 180.0000 mg | ORAL_CAPSULE | Freq: Every day | ORAL | 0 refills | Status: DC

## 2021-03-08 NOTE — Telephone Encounter (Signed)
Requested Prescriptions   Signed Prescriptions Disp Refills   diltiazem (CARDIZEM CD) 180 MG 24 hr capsule 90 capsule 0    Sig: Take 1 capsule (180 mg total) by mouth daily.    Authorizing Provider: Kate Sable    Ordering User: Janan Ridge   metoprolol succinate (TOPROL-XL) 100 MG 24 hr tablet 90 tablet 0    Sig: Take 1 tablet (100 mg total) by mouth daily. Take with or immediately following a meal.    Authorizing Provider: Kate Sable    Ordering User: Janan Ridge

## 2021-03-08 NOTE — Telephone Encounter (Signed)
*  STAT* If patient is at the pharmacy, call can be transferred to refill team.   1. Which medications need to be refilled? (please list name of each medication and dose if known)  Diltiazem 180 MG 24 hr 1 capsule daily Metoprolol succ 100 MG 1 tablet daily If patient does not need to continue medication, please advise daughter   2. Which pharmacy/location (including street and city if local pharmacy) is medication to be sent to? [facility] in [place]   3. Do they need a 30 day or 90 day supply? 90 day

## 2021-03-31 DIAGNOSIS — I10 Essential (primary) hypertension: Secondary | ICD-10-CM | POA: Diagnosis not present

## 2021-03-31 DIAGNOSIS — I4891 Unspecified atrial fibrillation: Secondary | ICD-10-CM | POA: Diagnosis not present

## 2021-03-31 DIAGNOSIS — R454 Irritability and anger: Secondary | ICD-10-CM | POA: Diagnosis not present

## 2021-03-31 DIAGNOSIS — N1832 Chronic kidney disease, stage 3b: Secondary | ICD-10-CM | POA: Diagnosis not present

## 2021-03-31 DIAGNOSIS — E876 Hypokalemia: Secondary | ICD-10-CM | POA: Diagnosis not present

## 2021-05-04 DIAGNOSIS — D72829 Elevated white blood cell count, unspecified: Secondary | ICD-10-CM | POA: Diagnosis not present

## 2021-05-09 ENCOUNTER — Telehealth: Payer: Self-pay | Admitting: Internal Medicine

## 2021-05-09 NOTE — Telephone Encounter (Signed)
Pt c/o of Chest Pain: STAT if CP now or developed within 24 hours  1. Are you having CP right now? yes  2. Are you experiencing any other symptoms (ex. SOB, nausea, vomiting, sweating)? SOB,   3. How long have you been experiencing CP? 1 week ago, but has gotten "tighter"  4. Is your CP continuous or coming and going? continual  5. Have you taken Nitroglycerin? no ?

## 2021-05-09 NOTE — Telephone Encounter (Signed)
Spoke with pt's daughter, pt is HOH and daughter is speaking for pt.  Per dtr pt has had incr tightness in her chest for the past 1-2 weeks.  Worsening shortness of breath when she lies flat at night.  Pt has been sleeping with HOB elevated d/t SOB but chest tightness continues intermittently.  Unable to give BP but reports HR ranging 64-99 today.  Per daughter, pt feels she is in a fib and this is how she felt after recent hospital visit when she had new onset a fib.   Pt last seen as new pt in our office 02/04/21 for hospital f/u new onset a fib.  Pt continues Toprol XL 100 mg QD and Diltiazem 180 mg QD and Eliquis 5 mg BID.  Pt also takes Lasix 20 mg QD with Potassium 10 mEq QD.  Pt denies swelling. Does not check weight daily.  Dtr reports pt does feel stable at this time, but concerned with increasing symptoms r/t a fib.   Pt currently in recall for 6 mo f/u with Dr. Saunders Revel. Scheduled pt to be seen sooner in our office with Ignacia Bayley, NP 8/25.  Advised that pt continue to monitor BP/HR and if worsening s/s and/or pt becomes unstable to go to the emergency room.  Advised if HR consistently >100 and is stable to let us know prior to appt. Dtr voiced understanding and has no further questions at this time.

## 2021-05-12 ENCOUNTER — Encounter: Payer: Self-pay | Admitting: Nurse Practitioner

## 2021-05-12 ENCOUNTER — Telehealth: Payer: Self-pay | Admitting: Nurse Practitioner

## 2021-05-12 ENCOUNTER — Other Ambulatory Visit: Payer: Self-pay

## 2021-05-12 ENCOUNTER — Ambulatory Visit (INDEPENDENT_AMBULATORY_CARE_PROVIDER_SITE_OTHER): Payer: PPO | Admitting: Nurse Practitioner

## 2021-05-12 VITALS — BP 130/76 | HR 100 | Ht 63.0 in | Wt 145.0 lb

## 2021-05-12 DIAGNOSIS — I5033 Acute on chronic diastolic (congestive) heart failure: Secondary | ICD-10-CM

## 2021-05-12 DIAGNOSIS — Z79899 Other long term (current) drug therapy: Secondary | ICD-10-CM

## 2021-05-12 DIAGNOSIS — I4819 Other persistent atrial fibrillation: Secondary | ICD-10-CM

## 2021-05-12 DIAGNOSIS — I10 Essential (primary) hypertension: Secondary | ICD-10-CM

## 2021-05-12 DIAGNOSIS — R072 Precordial pain: Secondary | ICD-10-CM

## 2021-05-12 MED ORDER — METOPROLOL SUCCINATE ER 100 MG PO TB24: 150.0000 mg | ORAL_TABLET | Freq: Every day | ORAL | 0 refills | Status: DC

## 2021-05-12 MED ORDER — FUROSEMIDE 20 MG PO TABS: 40.0000 mg | ORAL_TABLET | Freq: Every day | ORAL | 3 refills | Status: DC

## 2021-05-12 MED ORDER — FUROSEMIDE 40 MG PO TABS: 40.0000 mg | ORAL_TABLET | Freq: Every day | ORAL | 3 refills | Status: DC

## 2021-05-12 NOTE — Telephone Encounter (Signed)
Called patient back and informed her of Nancy Lopez recommendation. Patient verbalized understanding and agreed with plan.

## 2021-05-12 NOTE — Progress Notes (Signed)
Office Visit    Patient Name: Nancy Lopez Date of Encounter: 05/12/2021  Primary Care Provider:  [place] Pink, MD Primary Cardiologist:  Kate Sable, MD  Chief Complaint    80 year old female with a history of hypertension, GERD, and diastolic dysfunction, who presents for follow-up related to persistent atrial fibrillation and edema.  Past Medical History    Past Medical History:  Diagnosis Date   Arthritis    right knee   Diastolic dysfunction    a. 01/2021 Echo: EF 55-60%, mild inflat HK, Gr1 DD, nl RV fxn. Mild to mod MR. Mod TR. Mild AI.   Diverticulosis    GERD (gastroesophageal reflux disease)    Hypertension    Osteopenia    Persistent atrial fibrillation (HCC)    a. Dx 01/2021 in the setting of PNA; b. CHA2DS2VASc 4-->eliquis.   Personal history of colonic polyps    Past Surgical History:  Procedure Laterality Date   ABDOMINAL HYSTERECTOMY  1979   BREAST CYST ASPIRATION     CHOLECYSTECTOMY     COLONOSCOPY WITH PROPOFOL N/A 09/06/2015   Procedure: COLONOSCOPY WITH PROPOFOL;  Surgeon: Hulen Luster, MD;  Location: [facility] ENDOSCOPY;  Service: Gastroenterology;  Laterality: N/A;   COLONOSCOPY WITH PROPOFOL N/A 11/10/2019   Procedure: COLONOSCOPY WITH PROPOFOL;  Surgeon: Toledo, Benay Pike, MD;  Location: [facility] ENDOSCOPY;  Service: Gastroenterology;  Laterality: N/A;   EXCISION METACARPAL MASS Right 08/31/2016   Procedure: EXCISION METACARPAL MASS;  Surgeon: Hessie Knows, MD;  Location: [facility] ORS;  Service: Orthopedics;  Laterality: Right;   HERNIA REPAIR      Allergies  No Known Allergies  History of Present Illness    80 year old female with the above past medical history including hypertension, GERD, persistent atrial fibrillation, and diastolic dysfunction.  She was hospitalized in early May with chest pain.  Troponins were normal.  She was found to be in atrial fibrillation with rapid ventricular response.  She was also diagnosed with pneumonia and was  treated with antibiotics.  Heart rate was controlled with Toprol and diltiazem.  Echo showed an EF of 55 to 60% with mild inferolateral hypokinesis, grade 1 diastolic dysfunction, mild to moderate mitral regurgitation, and moderate tricuspid regurgitation.  She was subsequently discharged home and referred to Dr. Garen Lah on May 20.  At that time, she was feeling well and was reasonably rate controlled.  She had experienced edema following hospital discharge which was being managed with low-dose Lasix at that time.  Decision was made to continue rate control.  Following her last visit, she did well but then late last weekend/earlier this week, she began to notice weight gain and swelling of her lower extremities.  Her weight went up about 6 pounds.  She also experienced chest tightness and dyspnea while working.  On Tuesday morning, she started taking an old Maxide prescription and by yesterday morning, her weight had come down and swelling improved significantly.  Also, chest tightness and dyspnea seem to have resolved.  She feels much better this morning.  She denies palpitations, PND, orthopnea, dizziness, syncope, or early satiety.  Of note, Lasix is on her medicine list but she is not sure if she is taking this.  Home Medications    Current Outpatient Medications  Medication Sig Dispense Refill   apixaban (ELIQUIS) 5 MG TABS tablet Take 1 tablet (5 mg total) by mouth 2 (two) times daily. 60 tablet 0   citalopram (CELEXA) 10 MG tablet Take 10 mg by mouth daily.  diltiazem (CARDIZEM CD) 180 MG 24 hr capsule Take 1 capsule (180 mg total) by mouth daily. 90 capsule 0   furosemide (LASIX) 20 MG tablet Take 20 mg by mouth daily.     KLOR-CON M10 10 MEQ tablet Take 10 mEq by mouth daily.     metoprolol succinate (TOPROL-XL) 100 MG 24 hr tablet Take 1 tablet (100 mg total) by mouth daily. Take with or immediately following a meal. 90 tablet 0   triamterene-hydrochlorothiazide (MAXZIDE-25) 37.5-25 MG  tablet Take 1 tablet by mouth daily.     No current facility-administered medications for this visit.     Review of Systems    Recent increase in chest tightness with associated dyspnea and lower extremity edema with 6 pound weight gain.  Chest pain and dyspnea symptoms have resolved and weight is back down.  Still with trace edema.  She denies palpitations, PND, orthopnea, dizziness, syncope, or early satiety.  All other systems reviewed and are otherwise negative except as noted above.  Physical Exam    VS:  BP 130/76 (BP Location: Left Arm, Patient Position: Sitting, Cuff Size: Normal)   Pulse 100   Ht '5\' 3"'$  (1.6 m)   Wt 145 lb (65.8 kg)   SpO2 98%   BMI 25.69 kg/m  , BMI Body mass index is 25.69 kg/m.     GEN: Well nourished, well developed, in no acute distress. HEENT: normal. Neck: Supple, no JVD, carotid bruits, or masses. Cardiac: IR, IR, , 2/6 blowing quality syst murmur @ the LLSB, no rubs, or gallops. No clubbing, cyanosis/  Trace bilat LE edema.  PT 1+ and equal bilaterally.  Respiratory:  Respirations regular and unlabored, clear to auscultation bilaterally. GI: Soft, nontender, nondistended, BS + x 4. MS: no deformity or atrophy. Skin: warm and dry, no rash. Neuro:  Strength and sensation are intact. Psych: Normal affect.  Accessory Clinical Findings    ECG personally reviewed by me today -atrial fibrillation, 100, leftward axis, PVC- no acute changes.  Lab Results  Component Value Date   WBC 10.9 (H) 01/20/2021   HGB 10.1 (L) 01/20/2021   HCT 30.1 (L) 01/20/2021   MCV 87.8 01/20/2021   PLT 237 01/20/2021   Lab Results  Component Value Date   CREATININE 0.74 01/20/2021   BUN 14 01/20/2021   NA 137 01/20/2021   K 4.1 01/20/2021   CL 111 01/20/2021   CO2 20 (L) 01/20/2021   Lab Results  Component Value Date   ALT 42 01/18/2021   AST 35 01/18/2021   ALKPHOS 94 01/18/2021   BILITOT 0.9 01/18/2021    Assessment & Plan    1.  Acute on chronic  diastolic congestive heart failure: Patient had been doing well but then earlier this week she began to experience chest tightness, dyspnea, and weight gain with lower extremity edema.  In that setting, she started back on Maxide, which she was taking prior to hospitalization in May.  With resumption of Maxide, her weight is down 6 pounds and she has had resolution of chest tightness and dyspnea.  She has only trace ankle edema on exam today.  Blood pressure is 130/76 with a heart rate of 100.  She denies any palpitations.  I have asked her to increase her Toprol-XL to 150 mg daily.  She has both Lasix and Maxide on her medicine list at this time and she is not sure if she is taking Lasix.  She is going to call us back and  if she is still taking both, I would prefer that we simply increase the Lasix to 40 mg daily and discontinue the Maxide, with plan for follow-up basic metabolic panel 1 where the other next week.  I am concerned that relatively rapid rates have been contributing to her diastolic heart failure and ultimately, she may require cardioversion.  As outlined below, in the setting of recent chest tightness, I will obtain a Lexiscan Myoview. The benefits (risk stratification, diagnosing coronary artery disease, treatment guidance) and risks [chest pain, dyspnea, cardiac arrhythmias, dizziness, low blood pressure, allergic reaction, heart attack, and life-threatening complications (estimated to be 1 in 10,000)] were discussed in detail with Garnette Gunner and she agrees to proceed.  2.  Chest tightness: Patient recently with recurrent chest tightness associated with dyspnea as well as lower extremity edema.  This is since resolved.  She had chest tightness on presentation with A. fib with RVR in May with normal troponins at that time.  She has never undergone ischemic evaluation.  Though I suspect elevated rates may be contributing to heart failure symptoms, in the setting of chest tightness and  dyspnea, I will arrange for a Lexiscan Myoview to rule out ischemia.  Imaging will also give [place] information regarding her EF over the past 3 months.  Continue beta-blocker therapy.  3.  Persistent atrial fibrillation: Present since at least May of this year.  Rate mildly elevated 100 bpm today.  Heart rates on her watch show that she has been trending in the 90s to low 100s.  I am increasing her Toprol-XL to 150 mg daily.  Continue current dose of diltiazem.  She remains anticoagulated with Eliquis and has been compliant.  As above, I am concerned that poor rate control may be contributing to diastolic dysfunction.  If she continues have volume excess issues or difficulty managing rates, I would have a low threshold to pursue cardioversion.   4.  Essential hypertension: Relatively stable at 130/76.  Adjusting metoprolol dose in the setting of elevated rates.  5.  Disposition: Patient will have a follow-up basic metabolic panel and Lexiscan Myoview next week.  She needs to call us back to tell us whether or not she is taking Lasix.  Follow-up in clinic in 3 to 4 weeks.  Murray Hodgkins, NP 05/12/2021, 11:37 AM

## 2021-05-12 NOTE — Telephone Encounter (Signed)
Please have pt increase lasix to '40mg'$  daily and stop maxide, which she started on her own on Tuesday.  F/u bmet already ordered for next week.

## 2021-05-12 NOTE — Patient Instructions (Addendum)
Medication Instructions:  - Your physician has recommended you make the following change in your medication:   1) INCREASE Toprol (metoprolol succinate) 100 mg- take 1.5 tablets (150 mg) by mouth once daily  2) please call the office to clarify if you are taking Lasix (furosemide) and if so, what dose and how many times a day  *If you need a refill on your cardiac medications before your next appointment, please call your pharmacy*   Lab Work: - Your physician recommends that you return for lab work in: 1 week- BMP  If you have labs (blood work) drawn today and your tests are completely normal, you will receive your results only by: [place] (if you have [place]) OR A paper copy in the mail If you have any lab test that is abnormal or we need to change your treatment, we will call you to review the results.   Testing/Procedures: - Your physician has requested that you have a Lexicographer (Cardiac Nuclear Scan/ Chemical Stress Test)  [place] caregiver has ordered a Stress Test with nuclear imaging. The purpose of this test is to evaluate the blood supply to your heart muscle. This procedure is referred to as a "Non-Invasive Stress Test." This is because other than having an IV started in your vein, nothing is inserted or "invades" your body. Cardiac stress tests are done to find areas of poor blood flow to the heart by determining the extent of coronary artery disease (CAD). Some patients exercise on a treadmill, which naturally increases the blood flow to your heart, while others who are  unable to walk on a treadmill due to physical limitations have a pharmacologic/chemical stress agent called Lexiscan . This medicine will mimic walking on a treadmill by temporarily increasing your coronary blood flow.   Please note: these test may take anywhere between 2-4 hours to complete  PLEASE REPORT TO [place] AT THE FIRST DESK WILL DIRECT  YOU WHERE TO GO  Date of Procedure:_____________________________________  Arrival Time for Procedure:______________________________  Instructions regarding medication:   __x__ : HOLD lasix (furosemide) the morning of your test, if you are taking this currently  __x__:  Hold betablocker(s)- METOPROLOL, the night before procedure and morning of procedure  __x__:  You may take your other regular morning medications as usual, the day of your test, that are not listed above  PLEASE NOTIFY THE OFFICE AT LEAST 24 HOURS IN ADVANCE IF YOU ARE UNABLE TO [place].  [phone] AND  PLEASE NOTIFY NUCLEAR MEDICINE AT [facility] AT LEAST 24 HOURS IN ADVANCE IF YOU ARE UNABLE TO KEEP YOUR APPOINTMENT. [phone]  How to prepare for your Myoview test:  Do not eat or drink after midnight No caffeine for 24 hours prior to test No smoking 24 hours prior to test. Your medication may be taken with water.  If your doctor stopped a medication because of this test, do not take that medication. Ladies, please do not wear dresses.  Skirts or pants are appropriate. Please wear a short sleeve shirt. No perfume, cologne or lotion. Wear comfortable walking shoes. No heels!    Follow-Up: At [facility], you and your health needs are our priority.  As part of our continuing mission to provide you with exceptional heart care, we have created designated Provider Care Teams.  These Care Teams include your primary Cardiologist (physician) and Advanced Practice Providers (APPs -  Physician Assistants and Nurse Practitioners) who all work  together to provide you with the care you need, when you need it.  We recommend signing up for the patient portal called "MyChart".  Sign up information is provided on this After Visit Summary.  MyChart is used to connect with patients for Virtual Visits (Telemedicine).  Patients are able to view lab/test results, encounter notes, upcoming appointments, etc.  Non-urgent  messages can be sent to your provider as well.   To learn more about what you can do with MyChart, go to [email or web address].    Your next appointment:   3-4 week(s)  The format for your next appointment:   In Person  Provider:   You may see Kate Sable, MD or one of the following Advanced Practice Providers on your designated Care Team:   Murray Hodgkins, NP    Other Instructions  Cardiac Nuclear Scan A cardiac nuclear scan is a test that is done to check the flow of blood to your heart. It is done when you are resting and when you are exercising. The test looks for problems such as: Not enough blood reaching a portion of the heart. The heart muscle not working as it should. You may need this test if: You have heart disease. You have had lab results that are not normal. You have had heart surgery or a balloon procedure to open up blocked arteries (angioplasty). You have chest pain. You have shortness of breath. In this test, a special dye (tracer) is put into your bloodstream. The tracer will travel to your heart. A camera will then take pictures of your heart to see how the tracer moves through yourheart. This test is usually done at a hospital and takes 2-4 hours. Tell a doctor about: Any allergies you have. All medicines you are taking, including vitamins, herbs, eye drops, creams, and over-the-counter medicines. Any problems you or family members have had with anesthetic medicines. Any blood disorders you have. Any surgeries you have had. Any medical conditions you have. Whether you are pregnant or may be pregnant. What are the risks? Generally, this is a safe test. However, problems may occur, such as: Serious chest pain and heart attack. This is only a risk if the stress portion of the test is done. Rapid heartbeat. A feeling of warmth in your chest. This feeling usually does not last long. Allergic reaction to the tracer. What happens before the  test? Ask your doctor about changing or stopping your normal medicines. This is important. Follow instructions from your doctor about what you cannot eat or drink. Remove your jewelry on the day of the test. What happens during the test? An IV tube will be inserted into one of your veins. Your doctor will give you a small amount of tracer through the IV tube. You will wait for 20-40 minutes while the tracer moves through your bloodstream. Your heart will be monitored with an electrocardiogram (ECG). You will lie down on an exam table. Pictures of your heart will be taken for about 15-20 minutes. You may also have a stress test. For this test, one of these things may be done: You will be asked to exercise on a treadmill or a stationary bike. You will be given medicines that will make your heart work harder. This is done if you are unable to exercise. When blood flow to your heart has peaked, a tracer will again be given through the IV tube. After 20-40 minutes, you will get back on the exam table. More pictures will be taken  of your heart. Depending on the tracer that is used, more pictures may need to be taken 3-4 hours later. Your IV tube will be removed when the test is over. The test may vary among doctors and hospitals. What happens after the test? Ask your doctor: Whether you can return to your normal schedule, including diet, activities, and medicines. Whether you should drink more fluids. This will help to remove the tracer from your body. Drink enough fluid to keep your pee (urine) pale yellow. Ask your doctor, or the department that is doing the test: When will my results be ready? How will I get my results? Summary A cardiac nuclear scan is a test that is done to check the flow of blood to your heart. Tell your doctor whether you are pregnant or may be pregnant. Before the test, ask your doctor about changing or stopping your normal medicines. This is important. Ask your  doctor whether you can return to your normal activities. You may be asked to drink more fluids. This information is not intended to replace advice given to you by your health care provider. Make sure you discuss any questions you have with your healthcare provider. Document Revised: 12/25/2018 Document Reviewed: 02/18/2018 Elsevier Patient Education  [place].

## 2021-05-12 NOTE — Telephone Encounter (Signed)
Patient states she is taking Furosemide 20 mg once a day.

## 2021-05-17 ENCOUNTER — Other Ambulatory Visit: Payer: Self-pay

## 2021-05-17 MED ORDER — DILTIAZEM HCL ER COATED BEADS 180 MG PO CP24: 180.0000 mg | ORAL_CAPSULE | Freq: Every day | ORAL | 3 refills | Status: DC

## 2021-05-18 ENCOUNTER — Other Ambulatory Visit: Payer: Self-pay

## 2021-05-18 ENCOUNTER — Encounter
Admission: RE | Admit: 2021-05-18 | Discharge: 2021-05-18 | Disposition: A | Discharge status: Home / Self Care | Payer: PPO | Source: Ambulatory Visit | Attending: Nurse Practitioner | Admitting: Nurse Practitioner

## 2021-05-18 DIAGNOSIS — R072 Precordial pain: Secondary | ICD-10-CM

## 2021-05-18 LAB — NM MYOCAR MULTI W/SPECT W/WALL MOTION / EF
LV dias vol: 61 mL (ref 46–106)
LV sys vol: 23 mL
Nuc Stress EF: 62 %
Peak HR: 126 {beats}/min
Percent HR: 89 %
Rest HR: 93 {beats}/min
Rest Nuclear Isotope Dose: 10.3 mCi
SDS: 3
SRS: 5
SSS: 4
ST Depression (mm): 0 mm
Stress Nuclear Isotope Dose: 30.6 mCi
TID: 0.75

## 2021-05-18 MED ORDER — TECHNETIUM TC 99M TETROFOSMIN IV KIT
10.0000 | PACK | Freq: Once | INTRAVENOUS | Status: AC
  Administered 2021-05-18: 10.27 via INTRAVENOUS

## 2021-05-18 MED ORDER — TECHNETIUM TC 99M TETROFOSMIN IV KIT
30.6000 | PACK | Freq: Once | INTRAVENOUS | Status: AC | PRN
  Administered 2021-05-18: 30.6 via INTRAVENOUS

## 2021-05-18 MED ORDER — REGADENOSON 0.4 MG/5ML IV SOLN
0.4000 mg | Freq: Once | INTRAVENOUS | Status: AC
  Administered 2021-05-18: 0.4 mg via INTRAVENOUS

## 2021-05-20 ENCOUNTER — Other Ambulatory Visit (INDEPENDENT_AMBULATORY_CARE_PROVIDER_SITE_OTHER): Payer: PPO | Admitting: *Deleted

## 2021-05-20 ENCOUNTER — Other Ambulatory Visit: Payer: Self-pay

## 2021-05-20 DIAGNOSIS — Z79899 Other long term (current) drug therapy: Secondary | ICD-10-CM

## 2021-05-20 DIAGNOSIS — R072 Precordial pain: Secondary | ICD-10-CM

## 2021-05-21 LAB — BASIC METABOLIC PANEL
BUN/Creatinine Ratio: 17 (ref 12–28)
BUN: 15 mg/dL (ref 8–27)
CO2: 22 mmol/L (ref 20–29)
Calcium: 8.6 mg/dL — ABNORMAL LOW (ref 8.7–10.3)
Chloride: 102 mmol/L (ref 96–106)
Creatinine, Ser: 0.89 mg/dL (ref 0.57–1.00)
Glucose: 96 mg/dL (ref 65–99)
Potassium: 3.6 mmol/L (ref 3.5–5.2)
Sodium: 139 mmol/L (ref 134–144)
eGFR: 66 mL/min/{1.73_m2} (ref >59)

## 2021-05-31 DIAGNOSIS — R454 Irritability and anger: Secondary | ICD-10-CM | POA: Diagnosis not present

## 2021-05-31 DIAGNOSIS — I4891 Unspecified atrial fibrillation: Secondary | ICD-10-CM | POA: Diagnosis not present

## 2021-05-31 DIAGNOSIS — I10 Essential (primary) hypertension: Secondary | ICD-10-CM | POA: Diagnosis not present

## 2021-06-07 DIAGNOSIS — D649 Anemia, unspecified: Secondary | ICD-10-CM | POA: Diagnosis not present

## 2021-06-09 ENCOUNTER — Ambulatory Visit (INDEPENDENT_AMBULATORY_CARE_PROVIDER_SITE_OTHER): Payer: PPO | Admitting: Nurse Practitioner

## 2021-06-09 ENCOUNTER — Other Ambulatory Visit: Payer: Self-pay

## 2021-06-09 ENCOUNTER — Encounter: Payer: Self-pay | Admitting: Nurse Practitioner

## 2021-06-09 VITALS — BP 138/78 | HR 100 | Ht 63.0 in | Wt 145.0 lb

## 2021-06-09 DIAGNOSIS — I4819 Other persistent atrial fibrillation: Secondary | ICD-10-CM

## 2021-06-09 DIAGNOSIS — I10 Essential (primary) hypertension: Secondary | ICD-10-CM

## 2021-06-09 DIAGNOSIS — I5032 Chronic diastolic (congestive) heart failure: Secondary | ICD-10-CM | POA: Diagnosis not present

## 2021-06-09 NOTE — Patient Instructions (Signed)
Medication Instructions:  No changes at this time.   *If you need a refill on your cardiac medications before your next appointment, please call your pharmacy*   Lab Work: None  If you have labs (blood work) drawn today and your tests are completely normal, you will receive your results only by: [place] (if you have MyChart) OR A paper copy in the mail If you have any lab test that is abnormal or we need to change your treatment, we will call you to review the results.   Testing/Procedures: None   Follow-Up: At [facility], you and your health needs are our priority.  As part of our continuing mission to provide you with exceptional heart care, we have created designated Provider Care Teams.  These Care Teams include your primary Cardiologist (physician) and Advanced Practice Providers (APPs -  Physician Assistants and Nurse Practitioners) who all work together to provide you with the care you need, when you need it.  We recommend signing up for the patient portal called "MyChart".  Sign up information is provided on this After Visit Summary.  MyChart is used to connect with patients for Virtual Visits (Telemedicine).  Patients are able to view lab/test results, encounter notes, upcoming appointments, etc.  Non-urgent messages can be sent to your provider as well.   To learn more about what you can do with MyChart, go to [email or web address].    Your next appointment:   3 month(s)  The format for your next appointment:   In Person  Provider:   Kate Sable, MD or Murray Hodgkins, NP

## 2021-06-09 NOTE — Progress Notes (Signed)
Office Visit    Patient Name: Nancy Lopez Date of Encounter: 06/09/2021  Primary Care Provider:  Maryland Pink, MD Primary Cardiologist:  Kate Sable, MD  Chief Complaint    80 year old female with history of hypertension, diastolic dysfunction, GERD, atrial fibrillation who presents for follow-up related to her persistent atrial fibrillation.   Past Medical History    Past Medical History:  Diagnosis Date   Arthritis    right knee   Chest pain    a. 04/2021 MV: EF 62%, no ischemia/infarct. No significant cor Ca2+.   Diastolic dysfunction    a. 01/2021 Echo: EF 55-60%, mild inflat HK, Gr1 DD, nl RV fxn. Mild to mod MR. Mod TR. Mild AI.   Diverticulosis    GERD (gastroesophageal reflux disease)    Hypertension    Osteopenia    Persistent atrial fibrillation (HCC)    a. Dx 01/2021 in the setting of PNA; b. CHA2DS2VASc 4-->eliquis.   Personal history of colonic polyps    Past Surgical History:  Procedure Laterality Date   ABDOMINAL HYSTERECTOMY  1979   BREAST CYST ASPIRATION     CHOLECYSTECTOMY     COLONOSCOPY WITH PROPOFOL N/A 09/06/2015   Procedure: COLONOSCOPY WITH PROPOFOL;  Surgeon: Hulen Luster, MD;  Location: [facility] ENDOSCOPY;  Service: Gastroenterology;  Laterality: N/A;   COLONOSCOPY WITH PROPOFOL N/A 11/10/2019   Procedure: COLONOSCOPY WITH PROPOFOL;  Surgeon: Toledo, Benay Pike, MD;  Location: [facility] ENDOSCOPY;  Service: Gastroenterology;  Laterality: N/A;   EXCISION METACARPAL MASS Right 08/31/2016   Procedure: EXCISION METACARPAL MASS;  Surgeon: Hessie Knows, MD;  Location: [facility] ORS;  Service: Orthopedics;  Laterality: Right;   HERNIA REPAIR      Allergies  No Known Allergies  History of Present Illness    80 year old female with history of hypertension, diastolic dysfunction, GERD, atrial fibrillation. In May 2022 patient present to the hospital with chest pain that radiated down both arms. She was noted to be in rapid atrial fibrillation that was new  onset for her. Troponins were negative during admission and she was rate controlled with the use of metoprolol and Cardizem. She was noted to have community acquired pneumonia that was treated with course of oral ABX. Echo during admission showed mild inferolateral hypokinesis, grade 1 diastolic function with EF 55-60%, severely dilated left atrium, mild to moderate MR. After discharge she developed worsening bilateral lower extremity edema and lasix 20 mg daily was started. She was doing well for a couple months then noticed increased swelling of her lower extremities and weight gain in August 2022. She also had associated chest tightness and dyspnea during exertion. She started back an old Maxide prescription that led to an improvement in her symptoms. The patient was sent for a Lexiscan Myoview to r/o ischemia that was completed on 05/18/21 that was a low risk study.   Today she states she is feeling much better. She says that her lower extremity swelling has almost completely resolved and denies SOB. The patient has run out of her Diltiazem and is asking if it can be refilled. She denies chest pain, palpitations, dyspnea, pnd, orthopnea, n, v, dizziness, syncope, edema, weight gain, or early satiety.   Home Medications    Current Outpatient Medications  Medication Sig Dispense Refill   apixaban (ELIQUIS) 5 MG TABS tablet Take 1 tablet (5 mg total) by mouth 2 (two) times daily. 60 tablet 0   citalopram (CELEXA) 10 MG tablet Take 10 mg by mouth daily.  diltiazem (CARDIZEM CD) 180 MG 24 hr capsule Take 1 capsule (180 mg total) by mouth daily. 90 capsule 3   furosemide (LASIX) 40 MG tablet Take 1 tablet (40 mg total) by mouth daily. 30 tablet 3   KLOR-CON M10 10 MEQ tablet Take 10 mEq by mouth daily.     metoprolol succinate (TOPROL-XL) 100 MG 24 hr tablet Take 1.5 tablets (150 mg total) by mouth daily. Take with or immediately following a meal. 270 tablet 0   No current facility-administered  medications for this visit.     Review of Systems    She denies chest pain, palpitations, dyspnea, pnd, orthopnea, n, v, dizziness, syncope, edema, weight gain, or early satiety.  All other systems reviewed and are otherwise negative except as noted above.  Physical Exam    VS:  BP 138/78 (BP Location: Left Arm, Patient Position: Sitting, Cuff Size: Normal)   Pulse 100   Ht 5\' 3"  (1.6 m)   Wt 145 lb (65.8 kg)   SpO2 97%   BMI 25.69 kg/m  , BMI Body mass index is 25.69 kg/m.     GEN: Well nourished, well developed, in no acute distress. HEENT: normal. Neck: Supple, no JVD, carotid bruits, or masses. Cardiac: IR, IR, tachy, no murmurs, rubs, or gallops. No clubbing, cyanosis. No edema. PT 2+ and equal bilaterally.  Respiratory:  Respirations regular and unlabored, clear to auscultation bilaterally. GI: Soft, nontender, nondistended, BS + x 4. MS: no deformity or atrophy. Skin: warm and dry, no rash. Neuro:  Strength and sensation are intact. Psych: Normal affect.  Accessory Clinical Findings     Lab Results  Component Value Date   WBC 10.9 (H) 01/20/2021   HGB 10.1 (L) 01/20/2021   HCT 30.1 (L) 01/20/2021   MCV 87.8 01/20/2021   PLT 237 01/20/2021   Lab Results  Component Value Date   CREATININE 0.89 05/20/2021   BUN 15 05/20/2021   NA 139 05/20/2021   K 3.6 05/20/2021   CL 102 05/20/2021   CO2 22 05/20/2021   Lab Results  Component Value Date   ALT 42 01/18/2021   AST 35 01/18/2021   ALKPHOS 94 01/18/2021   BILITOT 0.9 01/18/2021   Assessment & Plan    1. Chronic Diastolic Heart Failure: EF 55-60% by echo in 01/2021.  Recent adjustment in diuretic due to onset of lower ext edema (now on lasix 40 daily).  Patient states she is feeling much better and has noticed decreased leg swelling since starting 40 mg of lasix.  Recent neg MV.  Euvolemic on exam.  HR elevated but she has been out of her dilt, which we refilled today.  Cont ? blocker.  She was encouraged to  begin to weigh herself everyday.   2.  Persistent Atrial Fibrillation: Dx in 01/2021.  Noted to have sev dil LA @ that time.  Patient's HR is elevated in the office at 100 today but she states she has not taken her diltiazem in 1 week since she has run out. The pharmacy was contacted and there is diltiazem ready to be picked up from the patient.  Cont eliquis and ? blocker.  3. Essential HTN: Blood pressure is elevated in the office at 138/78. Her being off her diltiazem for a week has some responsibility for her uncontrolled hypertension. Encouraged to resume diltiazem and trend her blood pressures daily at home.   4. Disposition: Follow-up in 3 months. Follow HR and b/p closely after resuming her  diltiazem.    Murray Hodgkins, NP 06/09/2021, 5:42 PM

## 2021-06-16 DIAGNOSIS — H25813 Combined forms of age-related cataract, bilateral: Secondary | ICD-10-CM | POA: Diagnosis not present

## 2021-08-09 ENCOUNTER — Ambulatory Visit: Payer: PPO | Admitting: Physician Assistant

## 2021-08-09 ENCOUNTER — Telehealth: Payer: Self-pay | Admitting: Nurse Practitioner

## 2021-08-09 NOTE — Telephone Encounter (Signed)
Pt c/o swelling: STAT is pt has developed SOB within 24 hours  If swelling, where is the swelling located? All over visibly in face   How much weight have you gained and in what time span? Not sure weight is usually 130 -140 depending on sweets consumed   Have you gained 3 pounds in a day or 5 pounds in a week? Probably 5 in the last week   Do you have a log of your daily weights (if so, list)? No   Are you currently taking a fluid pill? Yes   Are you currently SOB? Yes increased last 2 days with exertion not too bad at rest   Have you traveled recently? No but still working 8 hrs a day on her feet    Scheduled same day per daughter request with Christell Faith 1120 am

## 2021-08-09 NOTE — Telephone Encounter (Signed)
Spoke with patients daughter per verbal permission with patient. She reports patient waking up with puffy face and eyes with swelling. Patient and daughter report increased shortness of breath. Discussed patient medications, weights, sodium, fluid intake, and compression sock/hose. Inquired about her weight and she reports 140 and at last visit she was 145. Patient in no acute distress so appointment rescheduled for tomorrow with APP that has seen her previously. She was agreeable with this change with no further questions at this time.

## 2021-08-09 NOTE — Progress Notes (Deleted)
Cardiology Office Note    Date:  08/09/2021   ID:  Nancy, Lopez Nov 23, 1940, MRN [id number]  PCP:  Maryland Pink, MD  Cardiologist:  Kate Sable, MD  Electrophysiologist:  None   Chief Complaint: ***  History of Present Illness:   Nancy Lopez is a 80 y.o. female with history of ***  ***   Labs independently reviewed: ***  Past Medical History:  Diagnosis Date   Arthritis    right knee   Chest pain    a. 04/2021 MV: EF 62%, no ischemia/infarct. No significant cor Ca2+.   Diastolic dysfunction    a. 01/2021 Echo: EF 55-60%, mild inflat HK, Gr1 DD, nl RV fxn. Mild to mod MR. Mod TR. Mild AI.   Diverticulosis    GERD (gastroesophageal reflux disease)    Hypertension    Osteopenia    Persistent atrial fibrillation (HCC)    a. Dx 01/2021 in the setting of PNA; b. CHA2DS2VASc 4-->eliquis.   Personal history of colonic polyps     Past Surgical History:  Procedure Laterality Date   ABDOMINAL HYSTERECTOMY  1979   BREAST CYST ASPIRATION     CHOLECYSTECTOMY     COLONOSCOPY WITH PROPOFOL N/A 09/06/2015   Procedure: COLONOSCOPY WITH PROPOFOL;  Surgeon: Hulen Luster, MD;  Location: [facility] ENDOSCOPY;  Service: Gastroenterology;  Laterality: N/A;   COLONOSCOPY WITH PROPOFOL N/A 11/10/2019   Procedure: COLONOSCOPY WITH PROPOFOL;  Surgeon: Toledo, Benay Pike, MD;  Location: [facility] ENDOSCOPY;  Service: Gastroenterology;  Laterality: N/A;   EXCISION METACARPAL MASS Right 08/31/2016   Procedure: EXCISION METACARPAL MASS;  Surgeon: Hessie Knows, MD;  Location: [facility] ORS;  Service: Orthopedics;  Laterality: Right;   HERNIA REPAIR      Current Medications: No outpatient medications have been marked as taking for the 08/09/21 encounter (Appointment) with Rise Mu, PA-C.    Allergies:   Patient has no known allergies.   Social History   Socioeconomic History   Marital status: Widowed    Spouse name: Not on file   Number of children: Not on file   Years of  education: Not on file   Highest education level: Not on file  Occupational History   Not on file  Tobacco Use   Smoking status: Never   Smokeless tobacco: Never  Vaping Use   Vaping Use: Never used  Substance and Sexual Activity   Alcohol use: No   Drug use: No   Sexual activity: Never  Other Topics Concern   Not on file  Social History Narrative   Not on file   Social Determinants of Health   Financial Resource Strain: Not on file  Food Insecurity: Not on file  Transportation Needs: Not on file  Physical Activity: Not on file  Stress: Not on file  Social Connections: Not on file     Family History:  The patient's family history is negative for Breast cancer.  ROS:   ROS   EKGs/Labs/Other Studies Reviewed:    Studies reviewed were summarized above. The additional studies were reviewed today:  Lexiscan MPI 04/2021:   The study is normal. The study is low risk.   No ST deviation was noted.   Nuclear stress EF: 62 %. The left ventricular ejection fraction is normal (55-65%). Left ventricular function is normal.   There is no evidence for ischemia   No significant coronary artery calcifications noted __________  2D echo 01/2021:  1. Mild Inferior Lateral Hypo.   2.  Mild inferior lateral hypo. Left ventricular ejection fraction, by  estimation, is 55 to 60%. The left ventricle has normal function. The left  ventricle demonstrates regional wall motion abnormalities (see scoring  diagram/findings for description).  Left ventricular diastolic parameters are consistent with Grade I  diastolic dysfunction (impaired relaxation).   3. Right ventricular systolic function is normal. The right ventricular  size is normal.   4. The mitral valve is abnormal. Mild to moderate mitral valve  regurgitation.   5. Tricuspid valve regurgitation is moderate.   6. The aortic valve is normal in structure. Aortic valve regurgitation is  mild.     EKG:  EKG is ordered today.  The  EKG ordered today demonstrates ***  Recent Labs: 01/18/2021: ALT 42; TSH 4.311 01/20/2021: Hemoglobin 10.1; Magnesium 2.2; Platelets 237 05/20/2021: BUN 15; Creatinine, Ser 0.89; Potassium 3.6; Sodium 139  Recent Lipid Panel No results found for: CHOL, TRIG, HDL, CHOLHDL, VLDL, LDLCALC, LDLDIRECT  PHYSICAL EXAM:    VS:  There were no vitals taken for this visit.  BMI: There is no height or weight on file to calculate BMI.  Physical Exam  Wt Readings from Last 3 Encounters:  06/09/21 145 lb (65.8 kg)  05/12/21 145 lb (65.8 kg)  02/04/21 147 lb (66.7 kg)     ASSESSMENT & PLAN:   ***   {Are you ordering a CV Procedure (e.g. stress test, cath, DCCV, TEE, etc)?   Press F2        :[id number]}     Disposition: F/u with Dr. Garen Lah or an APP in ***.   Medication Adjustments/Labs and Tests Ordered: Current medicines are reviewed at length with the patient today.  Concerns regarding medicines are outlined above. Medication changes, Labs and Tests ordered today are summarized above and listed in the Patient Instructions accessible in Encounters.   Signed, Christell Faith, PA-C 08/09/2021 8:29 AM     [facility] [address] [place], [place] [address] [phone]

## 2021-08-10 ENCOUNTER — Other Ambulatory Visit: Payer: Self-pay

## 2021-08-10 ENCOUNTER — Ambulatory Visit (INDEPENDENT_AMBULATORY_CARE_PROVIDER_SITE_OTHER): Payer: PPO | Admitting: Nurse Practitioner

## 2021-08-10 ENCOUNTER — Encounter: Payer: Self-pay | Admitting: Nurse Practitioner

## 2021-08-10 VITALS — BP 114/70 | HR 78 | Ht 64.0 in | Wt 151.5 lb

## 2021-08-10 DIAGNOSIS — I10 Essential (primary) hypertension: Secondary | ICD-10-CM

## 2021-08-10 DIAGNOSIS — I5033 Acute on chronic diastolic (congestive) heart failure: Secondary | ICD-10-CM

## 2021-08-10 DIAGNOSIS — I4819 Other persistent atrial fibrillation: Secondary | ICD-10-CM

## 2021-08-10 MED ORDER — KLOR-CON M10 10 MEQ PO TBCR: 10.0000 meq | EXTENDED_RELEASE_TABLET | Freq: Every day | ORAL | 2 refills | Status: DC

## 2021-08-10 NOTE — Patient Instructions (Signed)
Medication Instructions:  - Your physician has recommended you make the following change in your medication:   1) CHANGE lasix (furosemide) 40 mg: - take 1 tablet (40 mg) TWICE daily x 3 days, starting today, - then resume 1 tablet (40 mg) ONCE daily  *If you need a refill on your cardiac medications before your next appointment, please call your pharmacy*   Lab Work: - none ordered  If you have labs (blood work) drawn today and your tests are completely normal, you will receive your results only by: [place] (if you have MyChart) OR A paper copy in the mail If you have any lab test that is abnormal or we need to change your treatment, we will call you to review the results.   Testing/Procedures: - none ordered   Follow-Up: At [facility], you and your health needs are our priority.  As part of our continuing mission to provide you with exceptional heart care, we have created designated Provider Care Teams.  These Care Teams include your primary Cardiologist (physician) and Advanced Practice Providers (APPs -  Physician Assistants and Nurse Practitioners) who all work together to provide you with the care you need, when you need it.  We recommend signing up for the patient portal called "MyChart".  Sign up information is provided on this After Visit Summary.  MyChart is used to connect with patients for Virtual Visits (Telemedicine).  Patients are able to view lab/test results, encounter notes, upcoming appointments, etc.  Non-urgent messages can be sent to your provider as well.   To learn more about what you can do with MyChart, go to [email or web address].    Your next appointment:   1 month(s)  The format for your next appointment:   In Person  Provider:   You may see Kate Sable, MD or one of the following Advanced Practice Providers on your designated Care Team:    Murray Hodgkins, NP   :1   Other Instructions N/a

## 2021-08-10 NOTE — Progress Notes (Signed)
Office Visit    Patient Name: Nancy Lopez Date of Encounter: 08/10/2021  Primary Care Provider:  Maryland Pink, MD Primary Cardiologist:  Kate Sable, MD  Chief Complaint    80 year old female with history of HTN, diastolic dysfunction, GERD, and atrial fibrillation who presents for follow-up related to dyspnea and edema.  Past Medical History    Past Medical History:  Diagnosis Date   Arthritis    right knee   Chest pain    a. 04/2021 MV: EF 62%, no ischemia/infarct. No significant cor Ca2+.   Diastolic dysfunction    a. 01/2021 Echo: EF 55-60%, mild inflat HK, Gr1 DD, nl RV fxn. Mild to mod MR. Mod TR. Mild AI.   Diverticulosis    GERD (gastroesophageal reflux disease)    Hypertension    Osteopenia    Persistent atrial fibrillation (HCC)    a. Dx 01/2021 in the setting of PNA; b. CHA2DS2VASc 4-->eliquis.   Personal history of colonic polyps    Past Surgical History:  Procedure Laterality Date   ABDOMINAL HYSTERECTOMY  1979   BREAST CYST ASPIRATION     CHOLECYSTECTOMY     COLONOSCOPY WITH PROPOFOL N/A 09/06/2015   Procedure: COLONOSCOPY WITH PROPOFOL;  Surgeon: Hulen Luster, MD;  Location: [facility] ENDOSCOPY;  Service: Gastroenterology;  Laterality: N/A;   COLONOSCOPY WITH PROPOFOL N/A 11/10/2019   Procedure: COLONOSCOPY WITH PROPOFOL;  Surgeon: Toledo, Benay Pike, MD;  Location: [facility] ENDOSCOPY;  Service: Gastroenterology;  Laterality: N/A;   EXCISION METACARPAL MASS Right 08/31/2016   Procedure: EXCISION METACARPAL MASS;  Surgeon: Hessie Knows, MD;  Location: [facility] ORS;  Service: Orthopedics;  Laterality: Right;   HERNIA REPAIR      Allergies  No Known Allergies  History of Present Illness    80 year old female with history of HTN, diastolic dysfunction, GERD, and atrial fibrillation. In MAy 2022 the patient presented to the hospital with chest pain that radiated down both arms. She was noted to be in rapid atrial fibrillation that was new onset for her.  Troponins were negative during admission and she was rate controlled with the use of metoprolol and Cardizem. She was noted to have CAP that was treated with course of oral ABX. Echo during admission showed mild inferolateral hypokinesis, grade 1 diastolic function with EF 55-60%, mild to moderate MR. After discharge she developed worsening BLE edema and lasix 20 mg daily was started. She was doing well for a couple of months then noticed increased swelling of her BLE and weight gain in August 2022. She had associated chest tightness and dyspnea during exertion. She started back on old Maxide prescription that led to improvement in her symptoms. The patient was sent for a Lexiscan Myoview to r/o ischemia that was completed on 05/18/2021 that was a low risk study.   She presents to the clinic today with dyspnea and increased BLE edema that started Saturday. Monday morning she also noticed facial swelling, specifically under her eyes that is improved. Over the past week she started to bring [employer] and peanut butter to work instead of eating off the local food cart. She eats up to a half a sleeve at a time and per her daughter does not adhere to a low salt/sodium diet. Her weight has increased to 151.8 lbs from previous baseline of 145lbs. She has a scale at home but does not weight herself daily. She denies chest pain, palpitations, pnd, orthopnea, n, v, dizziness, syncope, early satiety.   Home Medications  Current Outpatient Medications  Medication Sig Dispense Refill   apixaban (ELIQUIS) 5 MG TABS tablet Take 1 tablet (5 mg total) by mouth 2 (two) times daily. 60 tablet 0   citalopram (CELEXA) 10 MG tablet Take 10 mg by mouth daily.     diltiazem (CARDIZEM CD) 180 MG 24 hr capsule Take 1 capsule (180 mg total) by mouth daily. 90 capsule 3   furosemide (LASIX) 40 MG tablet Take 1 tablet (40 mg total) by mouth daily. 30 tablet 3   KLOR-CON M10 10 MEQ tablet Take 10 mEq by mouth daily.      metoprolol succinate (TOPROL-XL) 100 MG 24 hr tablet Take 1.5 tablets (150 mg total) by mouth daily. Take with or immediately following a meal. 270 tablet 0   No current facility-administered medications for this visit.     Review of Systems    +++ Dyspnea, BLE edema She denies chest pain, palpitations, pnd, orthopnea, n, v, dizziness, syncope, early satiety. All other systems reviewed and are otherwise negative except as noted above.    Physical Exam    VS:  BP 114/70 (BP Location: Left Arm, Patient Position: Sitting, Cuff Size: Normal)   Pulse 78   Ht 5\' 4"  (1.626 m)   Wt 151 lb 8 oz (68.7 kg)   SpO2 98%   BMI 26.00 kg/m  , BMI Body mass index is 26 kg/m.     GEN: obese well developed, in no acute distress. HEENT: normal. Neck: Supple, moderately elev JVD, carotid bruits, or masses. Cardiac: irregularly irregular, no murmurs, rubs, or gallops. No clubbing, cyanosis. Trace Non-pitting edema. Radials/PT 2+ and equal bilaterally.  Respiratory:  Respirations regular and unlabored, clear to auscultation bilaterally. GI: Soft, nontender, nondistended, BS + x 4. MS: no deformity or atrophy. Skin: warm and dry, no rash. Neuro:  Strength and sensation are intact. Psych: Normal affect.  Accessory Clinical Findings    ECG personally reviewed by me today - A-fib, 74.  - no acute changes.  Lab Results  Component Value Date   WBC 10.9 (H) 01/20/2021   HGB 10.1 (L) 01/20/2021   HCT 30.1 (L) 01/20/2021   MCV 87.8 01/20/2021   PLT 237 01/20/2021   Lab Results  Component Value Date   CREATININE 0.89 05/20/2021   BUN 15 05/20/2021   NA 139 05/20/2021   K 3.6 05/20/2021   CL 102 05/20/2021   CO2 22 05/20/2021   Lab Results  Component Value Date   ALT 42 01/18/2021   AST 35 01/18/2021   ALKPHOS 94 01/18/2021   BILITOT 0.9 01/18/2021    Assessment & Plan    1.  Acute on chronic diastolic heart failure: EF 55-60% on echo in May of this year. She noticed increase in BLE  edema and dyspnea starting on Saturday. She woke up Tuesday morning with facial swelling prompting her to contact the clinic for an appointment. She notes greater dietary indiscretions over the past week, eating [employer] crackers and peanut butter daily.  Wt up ~ 6 lbs w/ evidence of mild volume overload on exam.  I advised her to increase her Lasix to 40 mg BID over the next 3 days then resume taking Lasix daily. I have asked her to weigh herself each morning with a goal weight of 142-145 lbs.  2. Persistent Atrial Fibrillation: On previous visit she had run out of Diltiazem for a week and HR was slightly elevated a 100. Her rate is better controlled in clinic today at  74. She has not had any chest pain or palpations with her dyspnea. Continue Diltiazem, beta-blocker, and Eliquis.    3. Essential Hypertension: Blood pressure in the office of 114/70. Advised to take home blood pressure regularly and keep log. Continue Diltiazem and beta-blocker  4. Disposition Follow-up in 1 month or sooner if necessary.     Murray Hodgkins, NP 08/10/2021, 11:47 AM

## 2021-09-04 ENCOUNTER — Other Ambulatory Visit: Payer: Self-pay | Admitting: Nurse Practitioner

## 2021-09-05 ENCOUNTER — Other Ambulatory Visit: Payer: Self-pay | Admitting: Nurse Practitioner

## 2021-09-13 ENCOUNTER — Ambulatory Visit: Payer: PPO | Admitting: Cardiology

## 2021-10-04 DIAGNOSIS — H25813 Combined forms of age-related cataract, bilateral: Secondary | ICD-10-CM | POA: Diagnosis not present

## 2021-10-14 ENCOUNTER — Other Ambulatory Visit: Payer: Self-pay

## 2021-10-14 ENCOUNTER — Encounter: Payer: Self-pay | Admitting: Cardiology

## 2021-10-14 ENCOUNTER — Ambulatory Visit: Payer: PPO | Admitting: Cardiology

## 2021-10-14 VITALS — BP 124/64 | HR 81 | Ht 63.0 in | Wt 153.0 lb

## 2021-10-14 DIAGNOSIS — I4819 Other persistent atrial fibrillation: Secondary | ICD-10-CM | POA: Diagnosis not present

## 2021-10-14 DIAGNOSIS — I10 Essential (primary) hypertension: Secondary | ICD-10-CM | POA: Diagnosis not present

## 2021-10-14 DIAGNOSIS — I5033 Acute on chronic diastolic (congestive) heart failure: Secondary | ICD-10-CM

## 2021-10-14 NOTE — Progress Notes (Signed)
Cardiology Office Note:    Date:  10/14/2021   ID:  Nancy Lopez, DOB July 28, 1941, MRN [id number]  PCP:  Maryland Pink, MD   [facility] Providers Cardiologist:  Kate Sable, MD     Referring MD: Maryland Pink, MD   Chief Complaint  Patient presents with   OTher    1 month follow up -- patient c/o dry cough and is not sure what to take for it. Meds verbally with patient.     History of Present Illness:    Nancy Lopez is a 81 y.o. female with a hx of hypertension, HFpEF, permanent A. fib, GERD who presents for follow-up.  Patient being seen for HFpEF and A. fib.  Currently takes Lasix 40 mg daily, states edema is usually well controlled.  She had to attend a funeral yesterday as such did not take Lasix.  Has not taken Lasix today.  Denies chest pain, shortness of breath, palpitations.  Has no bleeding issues with Eliquis.  Prior notes Echocardiogram 01/18/2021 showed normal systolic function, EF 55 to 60%, severely dilated left atrium, mild to moderate MR.  Past Medical History:  Diagnosis Date   Arthritis    right knee   Chest pain    a. 04/2021 MV: EF 62%, no ischemia/infarct. No significant cor Ca2+.   Diastolic dysfunction    a. 01/2021 Echo: EF 55-60%, mild inflat HK, Gr1 DD, nl RV fxn. Mild to mod MR. Mod TR. Mild AI.   Diverticulosis    GERD (gastroesophageal reflux disease)    Hypertension    Osteopenia    Persistent atrial fibrillation (HCC)    a. Dx 01/2021 in the setting of PNA; b. CHA2DS2VASc 4-->eliquis.   Personal history of colonic polyps     Past Surgical History:  Procedure Laterality Date   ABDOMINAL HYSTERECTOMY  1979   BREAST CYST ASPIRATION     CHOLECYSTECTOMY     COLONOSCOPY WITH PROPOFOL N/A 09/06/2015   Procedure: COLONOSCOPY WITH PROPOFOL;  Surgeon: Hulen Luster, MD;  Location: [facility] ENDOSCOPY;  Service: Gastroenterology;  Laterality: N/A;   COLONOSCOPY WITH PROPOFOL N/A 11/10/2019   Procedure: COLONOSCOPY WITH PROPOFOL;  Surgeon:  Toledo, Benay Pike, MD;  Location: [facility] ENDOSCOPY;  Service: Gastroenterology;  Laterality: N/A;   EXCISION METACARPAL MASS Right 08/31/2016   Procedure: EXCISION METACARPAL MASS;  Surgeon: Hessie Knows, MD;  Location: [facility] ORS;  Service: Orthopedics;  Laterality: Right;   HERNIA REPAIR      Current Medications: Current Meds  Medication Sig   apixaban (ELIQUIS) 5 MG TABS tablet Take 1 tablet (5 mg total) by mouth 2 (two) times daily.   citalopram (CELEXA) 10 MG tablet Take 10 mg by mouth daily.   diltiazem (CARDIZEM CD) 180 MG 24 hr capsule Take 1 capsule (180 mg total) by mouth daily.   furosemide (LASIX) 40 MG tablet TAKE 1 TABLET BY MOUTH EVERY DAY   KLOR-CON M10 10 MEQ tablet Take 1 tablet (10 mEq total) by mouth daily.   metoprolol succinate (TOPROL-XL) 100 MG 24 hr tablet TAKE 1.5 TABLETS (150 MG TOTAL) BY MOUTH DAILY. TAKE WITH OR IMMEDIATELY FOLLOWING A MEAL.     Allergies:   Patient has no known allergies.   Social History   Socioeconomic History   Marital status: Widowed    Spouse name: Not on file   Number of children: Not on file   Years of education: Not on file   Highest education level: Not on file  Occupational History  Not on file  Tobacco Use   Smoking status: Never   Smokeless tobacco: Never  Vaping Use   Vaping Use: Never used  Substance and Sexual Activity   Alcohol use: No   Drug use: No   Sexual activity: Never  Other Topics Concern   Not on file  Social History Narrative   Not on file   Social Determinants of Health   Financial Resource Strain: Not on file  Food Insecurity: Not on file  Transportation Needs: Not on file  Physical Activity: Not on file  Stress: Not on file  Social Connections: Not on file     Family History: The patient's family history is negative for Breast cancer.  ROS:   Please see the history of present illness.     All other systems reviewed and are negative.  EKGs/Labs/Other Studies Reviewed:    The following  studies were reviewed today:   EKG:  EKG is  ordered today.  The ekg ordered today demonstrates atrial fibrillation, heart rate 81  Recent Labs: 01/18/2021: ALT 42; TSH 4.311 01/20/2021: Hemoglobin 10.1; Magnesium 2.2; Platelets 237 05/20/2021: BUN 15; Creatinine, Ser 0.89; Potassium 3.6; Sodium 139  Recent Lipid Panel No results found for: CHOL, TRIG, HDL, CHOLHDL, VLDL, LDLCALC, LDLDIRECT   Risk Assessment/Calculations:      Physical Exam:    VS:  BP 124/64 (BP Location: Left Arm, Patient Position: Sitting, Cuff Size: Normal)    Pulse 81    Ht 5\' 3"  (1.6 m)    Wt 153 lb (69.4 kg)    SpO2 97%    BMI 27.10 kg/m     Wt Readings from Last 3 Encounters:  10/14/21 153 lb (69.4 kg)  08/10/21 151 lb 8 oz (68.7 kg)  06/09/21 145 lb (65.8 kg)     GEN:  Well nourished, well developed in no acute distress HEENT: Normal NECK: No JVD; No carotid bruits LYMPHATICS: No lymphadenopathy CARDIAC: Irregularly irregular, nontachycardic. RESPIRATORY:  Clear to auscultation without rales, wheezing or rhonchi  ABDOMEN: Soft, non-tender, non-distended MUSCULOSKELETAL:  1+ edema; No deformity  SKIN: Warm and dry NEUROLOGIC:  Alert and oriented x 3 PSYCHIATRIC:  Normal affect   ASSESSMENT:    1. Persistent atrial fibrillation ([place])   2. Acute on chronic heart failure with preserved ejection fraction (HFpEF) ([place])   3. Primary hypertension     PLAN:    In order of problems listed above:  Permanent atrial fibrillation, CHA2DS2-VASc score 4 (age, htn, gender).  Heart rate controlled.  Continue Toprol-XL, Cardizem CD.  Eliquis 5 mg twice daily.  Continue rate control strategy. HFpEF, 1+ leg edema.  Advised to take Lasix daily as prescribed.  Okay to take Lasix 40 mg twice daily x2 days then return to 40 mg daily. Hypertension, BP controlled.  Continue Cardizem, Toprol-XL.  Follow-up in 6 months.   Medication Adjustments/Labs and Tests Ordered: Current medicines are reviewed at length with the  patient today.  Concerns regarding medicines are outlined above.  Orders Placed This Encounter  Procedures   EKG 12-Lead   No orders of the defined types were placed in this encounter.   Patient Instructions  Medication Instructions:   Take Lasix 80 MG for 2 days and then return to your usual dose of 40 MG daily.   *If you need a refill on your cardiac medications before your next appointment, please call your pharmacy*   Lab Work: None ordered If you have labs (blood work) drawn today and your tests  are completely normal, you will receive your results only by: MyChart Message (if you have MyChart) OR A paper copy in the mail If you have any lab test that is abnormal or we need to change your treatment, we will call you to review the results.   Testing/Procedures: None Ordered   Follow-Up: At [facility], you and your health needs are our priority.  As part of our continuing mission to provide you with exceptional heart care, we have created designated Provider Care Teams.  These Care Teams include your primary Cardiologist (physician) and Advanced Practice Providers (APPs -  Physician Assistants and Nurse Practitioners) who all work together to provide you with the care you need, when you need it.  We recommend signing up for the patient portal called "MyChart".  Sign up information is provided on this After Visit Summary.  MyChart is used to connect with patients for Virtual Visits (Telemedicine).  Patients are able to view lab/test results, encounter notes, upcoming appointments, etc.  Non-urgent messages can be sent to your provider as well.   To learn more about what you can do with MyChart, go to [email or web address].    Your next appointment:   6 month(s)  The format for your next appointment:   In Person  Provider:   You may see Kate Sable, MD or one of the following Advanced Practice Providers on your designated Care Team:   Murray Hodgkins, NP Christell Faith, PA-C Cadence Kathlen Mody, [place]    Other Instructions     Signed, Kate Sable, MD  10/14/2021 2:47 PM    [place]

## 2021-10-14 NOTE — Patient Instructions (Signed)
Medication Instructions:   Take Lasix 80 MG for 2 days and then return to your usual dose of 40 MG daily.   *If you need a refill on your cardiac medications before your next appointment, please call your pharmacy*   Lab Work: None ordered If you have labs (blood work) drawn today and your tests are completely normal, you will receive your results only by: [place] (if you have MyChart) OR A paper copy in the mail If you have any lab test that is abnormal or we need to change your treatment, we will call you to review the results.   Testing/Procedures: None Ordered   Follow-Up: At [facility], you and your health needs are our priority.  As part of our continuing mission to provide you with exceptional heart care, we have created designated Provider Care Teams.  These Care Teams include your primary Cardiologist (physician) and Advanced Practice Providers (APPs -  Physician Assistants and Nurse Practitioners) who all work together to provide you with the care you need, when you need it.  We recommend signing up for the patient portal called "MyChart".  Sign up information is provided on this After Visit Summary.  MyChart is used to connect with patients for Virtual Visits (Telemedicine).  Patients are able to view lab/test results, encounter notes, upcoming appointments, etc.  Non-urgent messages can be sent to your provider as well.   To learn more about what you can do with MyChart, go to [email or web address].    Your next appointment:   6 month(s)  The format for your next appointment:   In Person  Provider:   You may see Kate Sable, MD or one of the following Advanced Practice Providers on your designated Care Team:   Murray Hodgkins, NP Christell Faith, PA-C Cadence Kathlen Mody, [place]    Other Instructions

## 2021-11-09 DIAGNOSIS — N1832 Chronic kidney disease, stage 3b: Secondary | ICD-10-CM | POA: Diagnosis not present

## 2021-11-09 DIAGNOSIS — R062 Wheezing: Secondary | ICD-10-CM | POA: Diagnosis not present

## 2021-11-09 DIAGNOSIS — R052 Subacute cough: Secondary | ICD-10-CM | POA: Diagnosis not present

## 2021-11-09 DIAGNOSIS — I480 Paroxysmal atrial fibrillation: Secondary | ICD-10-CM | POA: Diagnosis not present

## 2021-11-27 ENCOUNTER — Other Ambulatory Visit: Payer: Self-pay | Admitting: Nurse Practitioner

## 2021-11-29 DIAGNOSIS — H25043 Posterior subcapsular polar age-related cataract, bilateral: Secondary | ICD-10-CM | POA: Diagnosis not present

## 2021-11-29 DIAGNOSIS — H25013 Cortical age-related cataract, bilateral: Secondary | ICD-10-CM | POA: Diagnosis not present

## 2021-11-29 DIAGNOSIS — H2513 Age-related nuclear cataract, bilateral: Secondary | ICD-10-CM | POA: Diagnosis not present

## 2021-11-29 DIAGNOSIS — H18413 Arcus senilis, bilateral: Secondary | ICD-10-CM | POA: Diagnosis not present

## 2021-11-29 DIAGNOSIS — H2512 Age-related nuclear cataract, left eye: Secondary | ICD-10-CM | POA: Diagnosis not present

## 2022-01-30 DIAGNOSIS — J069 Acute upper respiratory infection, unspecified: Secondary | ICD-10-CM | POA: Diagnosis not present

## 2022-02-07 DIAGNOSIS — I480 Paroxysmal atrial fibrillation: Secondary | ICD-10-CM | POA: Diagnosis not present

## 2022-02-07 DIAGNOSIS — J40 Bronchitis, not specified as acute or chronic: Secondary | ICD-10-CM | POA: Diagnosis not present

## 2022-02-28 ENCOUNTER — Other Ambulatory Visit: Payer: Self-pay

## 2022-02-28 MED ORDER — FUROSEMIDE 40 MG PO TABS: 40.0000 mg | ORAL_TABLET | Freq: Every day | ORAL | 0 refills | Status: DC

## 2022-03-01 ENCOUNTER — Telehealth: Payer: Self-pay | Admitting: Cardiology

## 2022-03-01 ENCOUNTER — Other Ambulatory Visit: Payer: Self-pay | Admitting: Nurse Practitioner

## 2022-03-01 MED ORDER — APIXABAN 5 MG PO TABS: 5.0000 mg | ORAL_TABLET | Freq: Two times a day (BID) | ORAL | 5 refills | Status: DC

## 2022-03-01 NOTE — Telephone Encounter (Signed)
*  STAT* If patient is at the pharmacy, call can be transferred to refill team.   1. Which medications need to be refilled? (please list name of each medication and dose if known)   apixaban (ELIQUIS) 5 MG TABS tablet     2. Which pharmacy/location (including street and city if local pharmacy) is medication to be sent to? [id number] [place], [place]- [address]. Do they need a 30 day or 90 day supply? [address]

## 2022-03-01 NOTE — Telephone Encounter (Signed)
Refill Request.  

## 2022-03-01 NOTE — Telephone Encounter (Signed)
Pt last saw Dr Garen Lah 10/14/21, last labs 05/20/21 Creat 0.89, age 81, weight 69.4kg, based on specified criteria pt is on appropriate dosage of Eliquis '5mg'$  BID for afib.  Will refill rx.

## 2022-03-03 ENCOUNTER — Other Ambulatory Visit: Payer: Self-pay

## 2022-03-03 MED ORDER — DILTIAZEM HCL ER COATED BEADS 180 MG PO CP24: 180.0000 mg | ORAL_CAPSULE | Freq: Every day | ORAL | 0 refills | Status: DC

## 2022-03-06 DIAGNOSIS — H2512 Age-related nuclear cataract, left eye: Secondary | ICD-10-CM | POA: Diagnosis not present

## 2022-03-07 DIAGNOSIS — H2511 Age-related nuclear cataract, right eye: Secondary | ICD-10-CM | POA: Diagnosis not present

## 2022-03-07 DIAGNOSIS — Z961 Presence of intraocular lens: Secondary | ICD-10-CM | POA: Diagnosis not present

## 2022-03-07 NOTE — Progress Notes (Unsigned)
Cardiology Office Note    Date:  03/08/2022   ID:  Nancy, Lopez June 19, 1941, MRN [id number]  PCP:  Maryland Pink, MD  Cardiologist:  Kate Sable, MD  Electrophysiologist:  None   Chief Complaint: Follow-up  History of Present Illness:   Nancy Lopez is a 81 y.o. female with history of permanent A-fib on apixaban, HTN, HFpEF, and GERD who presents for follow-up of A-fib and HFpEF.  She was admitted to the hospital in 01/2021 with chest pain that radiated down both arms.  She was noted to be in new onset A-fib with RVR in the context of CAP.  High-sensitivity troponin was negative.  She was rate controlled with metoprolol and Cardizem.  Echo showed an EF of 55 to 60%, mild inferolateral hypokinesis, grade 1 diastolic dysfunction, and mild to moderate mitral regurgitation.  Rate control strategy was pursued.  She has previously required addition of furosemide due to volume overload and lower extremity swelling.  Lexiscan MPI in 04/2021 showed no evidence of ischemia or significant coronary artery calcification.  LVEF was 55 to 65%.  Overall, this was a low risk scan.  She was last seen in the office in 09/2021 with some progression of lower extremity edema in the context of holding her furosemide for a funeral.  She was advised to take Lasix 40 mg twice daily for 2 days then return to 40 mg daily thereafter.  She comes in doing very well from a cardiac perspective and is without symptoms of angina or decompensation.  No palpitations, presyncope, or syncope.  No significant lower extremity swelling or orthopnea.  No falls, hematochezia, or melena.  She is tolerating all cardiac medications without issues.  She has been taking Toprol-XL 100 mg daily rather than 150 mg daily.  Otherwise, she does not have any issues or concerns at this time.   Labs independently reviewed: 05/2021 - BUN 15, serum creatinine 0.89, potassium 3.6 04/2021 - Hgb 11.1, PLT 219 03/2021 - albumin 3.9, AST/ALT  normal 01/2021 - TC 145, TG 142, HDL 36, LDL 81, magnesium 2.2, TSH normal  Past Medical History:  Diagnosis Date   Arthritis    right knee   Chest pain    a. 04/2021 MV: EF 62%, no ischemia/infarct. No significant cor Ca2+.   Diastolic dysfunction    a. 01/2021 Echo: EF 55-60%, mild inflat HK, Gr1 DD, nl RV fxn. Mild to mod MR. Mod TR. Mild AI.   Diverticulosis    GERD (gastroesophageal reflux disease)    Hypertension    Osteopenia    Persistent atrial fibrillation (HCC)    a. Dx 01/2021 in the setting of PNA; b. CHA2DS2VASc 4-->eliquis.   Personal history of colonic polyps     Past Surgical History:  Procedure Laterality Date   ABDOMINAL HYSTERECTOMY  1979   BREAST CYST ASPIRATION     CATARACT EXTRACTION     CHOLECYSTECTOMY     COLONOSCOPY WITH PROPOFOL N/A 09/06/2015   Procedure: COLONOSCOPY WITH PROPOFOL;  Surgeon: Hulen Luster, MD;  Location: [facility] ENDOSCOPY;  Service: Gastroenterology;  Laterality: N/A;   COLONOSCOPY WITH PROPOFOL N/A 11/10/2019   Procedure: COLONOSCOPY WITH PROPOFOL;  Surgeon: Toledo, Benay Pike, MD;  Location: [facility] ENDOSCOPY;  Service: Gastroenterology;  Laterality: N/A;   EXCISION METACARPAL MASS Right 08/31/2016   Procedure: EXCISION METACARPAL MASS;  Surgeon: Hessie Knows, MD;  Location: [facility] ORS;  Service: Orthopedics;  Laterality: Right;   HERNIA REPAIR      Current Medications:  Current Meds  Medication Sig   apixaban (ELIQUIS) 5 MG TABS tablet Take 1 tablet (5 mg total) by mouth 2 (two) times daily.   citalopram (CELEXA) 10 MG tablet Take 10 mg by mouth daily.   diltiazem (CARDIZEM CD) 180 MG 24 hr capsule Take 1 capsule (180 mg total) by mouth daily.   furosemide (LASIX) 40 MG tablet Take 1 tablet (40 mg total) by mouth daily.   gatifloxacin (ZYMAXID) 0.5 % SOLN Place 1 drop into the left eye 4 (four) times daily.   ketorolac (ACULAR) 0.5 % ophthalmic solution Place 1 drop into the left eye 4 (four) times daily.   KLOR-CON M10 10 MEQ tablet TAKE 1  TABLET BY MOUTH EVERY DAY   metoprolol succinate (TOPROL-XL) 100 MG 24 hr tablet Take 100 mg by mouth daily. Take with or immediately following a meal.   prednisoLONE acetate (PRED FORTE) 1 % ophthalmic suspension Place 1 drop into the left eye 4 (four) times daily.    Allergies:   Patient has no known allergies.   Social History   Socioeconomic History   Marital status: Widowed    Spouse name: Not on file   Number of children: Not on file   Years of education: Not on file   Highest education level: Not on file  Occupational History   Not on file  Tobacco Use   Smoking status: Never   Smokeless tobacco: Never  Vaping Use   Vaping Use: Never used  Substance and Sexual Activity   Alcohol use: No   Drug use: No   Sexual activity: Never  Other Topics Concern   Not on file  Social History Narrative   Not on file   Social Determinants of Health   Financial Resource Strain: Not on file  Food Insecurity: Not on file  Transportation Needs: Not on file  Physical Activity: Not on file  Stress: Not on file  Social Connections: Not on file     Family History:  The patient's family history is negative for Breast cancer.  ROS:   12-point review of systems is negative unless otherwise noted in the HPI.   EKGs/Labs/Other Studies Reviewed:    Studies reviewed were summarized above. The additional studies were reviewed today:  Lexiscan MPI 05/18/2021:   The study is normal. The study is low risk.   No ST deviation was noted.   Nuclear stress EF: 62 %. The left ventricular ejection fraction is normal (55-65%). Left ventricular function is normal.   There is no evidence for ischemia   No significant coronary artery calcifications noted __________  2D echo 01/18/2021: 1. Mild Inferior Lateral Hypo.   2. Mild inferior lateral hypo. Left ventricular ejection fraction, by  estimation, is 55 to 60%. The left ventricle has normal function. The left  ventricle demonstrates regional  wall motion abnormalities (see scoring  diagram/findings for description).  Left ventricular diastolic parameters are consistent with Grade I  diastolic dysfunction (impaired relaxation).   3. Right ventricular systolic function is normal. The right ventricular  size is normal.   4. The mitral valve is abnormal. Mild to moderate mitral valve  regurgitation.   5. Tricuspid valve regurgitation is moderate.   6. The aortic valve is normal in structure. Aortic valve regurgitation is  mild.    EKG:  EKG is ordered today.  The EKG ordered today demonstrates A-fib, 85 bpm, poor R wave progression along the precordial leads, no acute ST-T changes, no significant change compared to  prior tracing  Recent Labs: 05/20/2021: BUN 15; Creatinine, Ser 0.89; Potassium 3.6; Sodium 139  Recent Lipid Panel No results found for: "CHOL", "TRIG", "HDL", "CHOLHDL", "VLDL", "LDLCALC", "LDLDIRECT"  PHYSICAL EXAM:    VS:  BP 124/90 (BP Location: Left Arm, Patient Position: Sitting, Cuff Size: Normal)   Pulse 85   Ht '5\' 4"'$  (1.626 m)   Wt 155 lb (70.3 kg)   SpO2 98%   BMI 26.61 kg/m   BMI: Body mass index is 26.61 kg/m.  Physical Exam Vitals reviewed.  Constitutional:      Appearance: She is well-developed.  HENT:     Head: Normocephalic and atraumatic.  Eyes:     General:        Right eye: No discharge.        Left eye: No discharge.  Neck:     Vascular: No JVD.  Cardiovascular:     Rate and Rhythm: Normal rate. Rhythm irregularly irregular.     Pulses:          Posterior tibial pulses are 2+ on the right side and 2+ on the left side.     Heart sounds: Normal heart sounds, S1 normal and S2 normal. Heart sounds not distant. No midsystolic click and no opening snap. No murmur heard.    No friction rub.  Pulmonary:     Effort: Pulmonary effort is normal. No respiratory distress.     Breath sounds: Normal breath sounds. No decreased breath sounds, wheezing or rales.  Chest:     Chest wall: No  tenderness.  Abdominal:     General: There is no distension.     Palpations: Abdomen is soft.  Musculoskeletal:     Cervical back: Normal range of motion.     Right lower leg: No edema.     Left lower leg: No edema.  Skin:    General: Skin is warm and dry.     Nails: There is no clubbing.  Neurological:     Mental Status: She is alert and oriented to person, place, and time.  Psychiatric:        Speech: Speech normal.        Behavior: Behavior normal.        Thought Content: Thought content normal.        Judgment: Judgment normal.     Wt Readings from Last 3 Encounters:  03/08/22 155 lb (70.3 kg)  10/14/21 153 lb (69.4 kg)  08/10/21 151 lb 8 oz (68.7 kg)     ASSESSMENT & PLAN:   HFpEF: She appears euvolemic and well compensated and remains on furosemide 40 mg daily with KCl 10 mEq daily.  Continue optimal blood pressure control.  Check BMP.  Permanent A-fib: Ventricular rates are well controlled on Toprol-XL 100 mg and Cardizem CD 180 mg.  She remains on apixaban 5 mg twice daily given a CHA2DS2-VASc at least 5 (CHF, HTN, age x2, sex category), and does not meet reduced dosing criteria.  Check BMP and CBC.  HTN: Blood pressure is well controlled in the office today.  Continue medications as outlined above.   Disposition: F/u with Dr. Garen Lah or an APP in 6 months.   Medication Adjustments/Labs and Tests Ordered: Current medicines are reviewed at length with the patient today.  Concerns regarding medicines are outlined above. Medication changes, Labs and Tests ordered today are summarized above and listed in the Patient Instructions accessible in Encounters.   Signed, Christell Faith, PA-C 03/08/2022 11:12 AM  [address] [place], [place] [address] [phone]

## 2022-03-08 ENCOUNTER — Ambulatory Visit: Payer: PPO | Admitting: Physician Assistant

## 2022-03-08 ENCOUNTER — Encounter: Payer: Self-pay | Admitting: Physician Assistant

## 2022-03-08 ENCOUNTER — Other Ambulatory Visit
Admission: RE | Admit: 2022-03-08 | Discharge: 2022-03-08 | Disposition: A | Discharge status: Home / Self Care | Payer: PPO | Source: Ambulatory Visit | Attending: Physician Assistant | Admitting: Physician Assistant

## 2022-03-08 VITALS — BP 124/90 | HR 85 | Ht 64.0 in | Wt 155.0 lb

## 2022-03-08 DIAGNOSIS — I10 Essential (primary) hypertension: Secondary | ICD-10-CM | POA: Diagnosis not present

## 2022-03-08 DIAGNOSIS — Z5181 Encounter for therapeutic drug level monitoring: Secondary | ICD-10-CM | POA: Insufficient documentation

## 2022-03-08 DIAGNOSIS — I4821 Permanent atrial fibrillation: Secondary | ICD-10-CM | POA: Diagnosis not present

## 2022-03-08 DIAGNOSIS — Z79899 Other long term (current) drug therapy: Secondary | ICD-10-CM

## 2022-03-08 DIAGNOSIS — I5032 Chronic diastolic (congestive) heart failure: Secondary | ICD-10-CM

## 2022-03-08 LAB — BASIC METABOLIC PANEL
Anion gap: 7 (ref 5–15)
BUN: 13 mg/dL (ref 8–23)
CO2: 28 mmol/L (ref 22–32)
Calcium: 9.1 mg/dL (ref 8.9–10.3)
Chloride: 106 mmol/L (ref 98–111)
Creatinine, Ser: 0.61 mg/dL (ref 0.44–1.00)
GFR, Estimated: >60 mL/min (ref >60)
Glucose, Bld: 99 mg/dL (ref 70–99)
Potassium: 3.9 mmol/L (ref 3.5–5.1)
Sodium: 141 mmol/L (ref 135–145)

## 2022-03-08 LAB — CBC
HCT: 37.2 % (ref 36.0–46.0)
Hemoglobin: 12 g/dL (ref 12.0–15.0)
MCH: 27.8 pg (ref 26.0–34.0)
MCHC: 32.3 g/dL (ref 30.0–36.0)
MCV: 86.3 fL (ref 80.0–100.0)
Platelets: 233 10*3/uL (ref 150–400)
RBC: 4.31 MIL/uL (ref 3.87–5.11)
RDW: 13.3 % (ref 11.5–15.5)
WBC: 10.3 10*3/uL (ref 4.0–10.5)
nRBC: 0 % (ref 0.0–0.2)

## 2022-03-08 NOTE — Patient Instructions (Signed)
Medication Instructions:  - Your physician recommends that you continue on your current medications as directed. Please refer to the Current Medication list given to you today.  *If you need a refill on your cardiac medications before your next appointment, please call your pharmacy*   Lab Work: - Your physician recommends that you have lab work today:   BMP/ [place] Entrance at [facility] 1st desk on the right to check in (REGISTRATION)  Lab hours: Monday- Friday (7:30 am- 5:30 pm)   If you have labs (blood work) drawn today and your tests are completely normal, you will receive your results only by: MyChart Message (if you have MyChart) OR A paper copy in the mail If you have any lab test that is abnormal or we need to change your treatment, we will call you to review the results.   Testing/Procedures: - none ordered   Follow-Up: At [facility], you and your health needs are our priority.  As part of our continuing mission to provide you with exceptional heart care, we have created designated Provider Care Teams.  These Care Teams include your primary Cardiologist (physician) and Advanced Practice Providers (APPs -  Physician Assistants and Nurse Practitioners) who all work together to provide you with the care you need, when you need it.  We recommend signing up for the patient portal called "MyChart".  Sign up information is provided on this After Visit Summary.  MyChart is used to connect with patients for Virtual Visits (Telemedicine).  Patients are able to view lab/test results, encounter notes, upcoming appointments, etc.  Non-urgent messages can be sent to your provider as well.   To learn more about what you can do with MyChart, go to [email or web address].    Your next appointment:   6 month(s)  The format for your next appointment:   In Person  Provider:   Christell Faith, PA-C    Other Instructions N/a  Important Information About Sugar

## 2022-04-03 DIAGNOSIS — H2511 Age-related nuclear cataract, right eye: Secondary | ICD-10-CM | POA: Diagnosis not present

## 2022-04-28 IMAGING — MG DIGITAL SCREENING BILAT W/ TOMO W/ CAD
8 series · 8 of 24 positions shown · non-contrast
Comparison: Previous exam(s).

CLINICAL DATA: Screening.

EXAM:
DIGITAL SCREENING BILATERAL MAMMOGRAM WITH TOMO AND CAD

[R CC synth-2D]
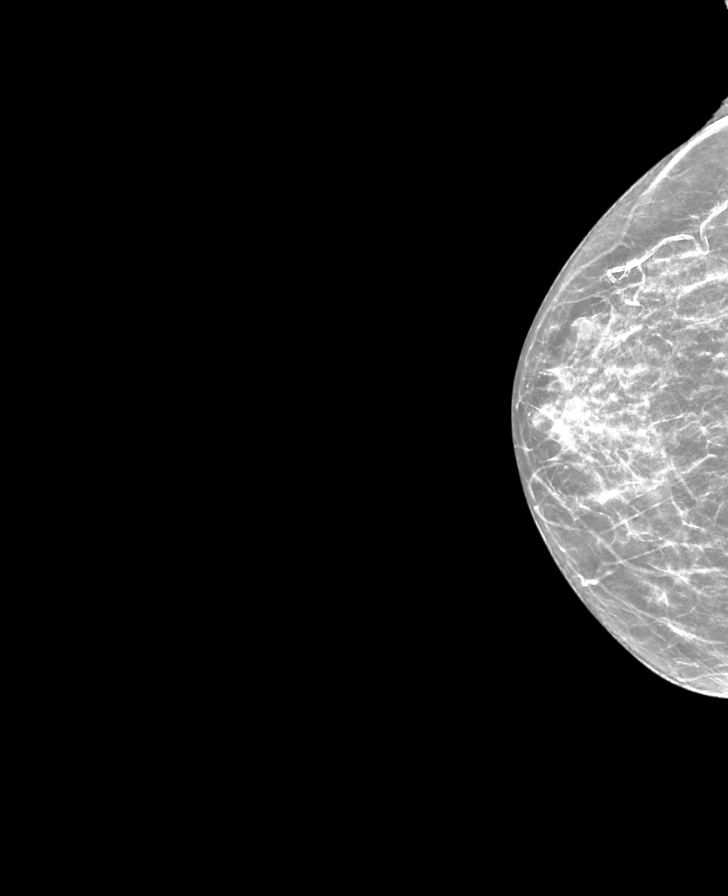

[R MLO synth-2D]
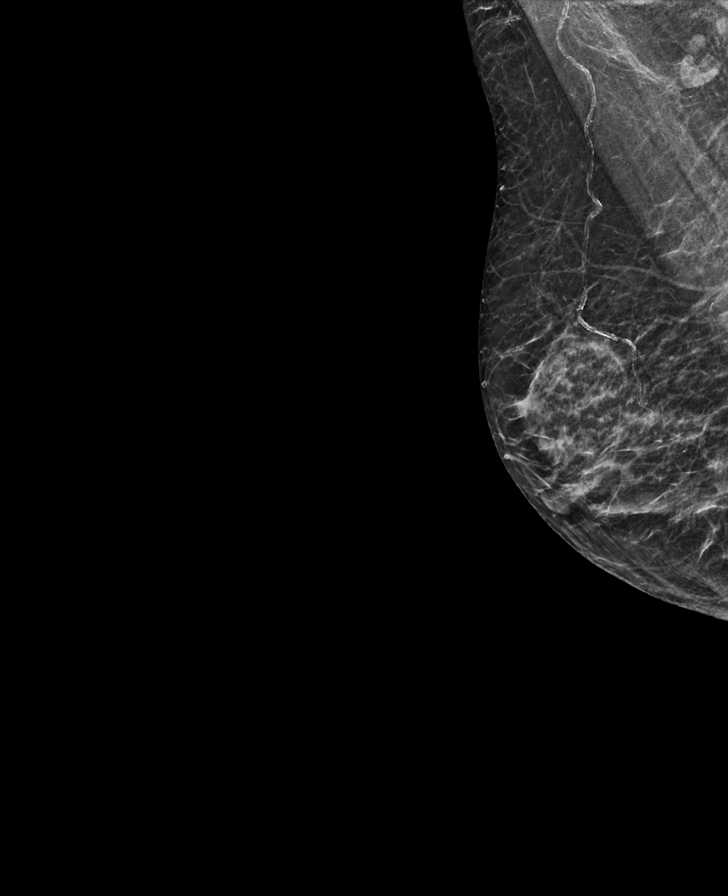

[L CC synth-2D]
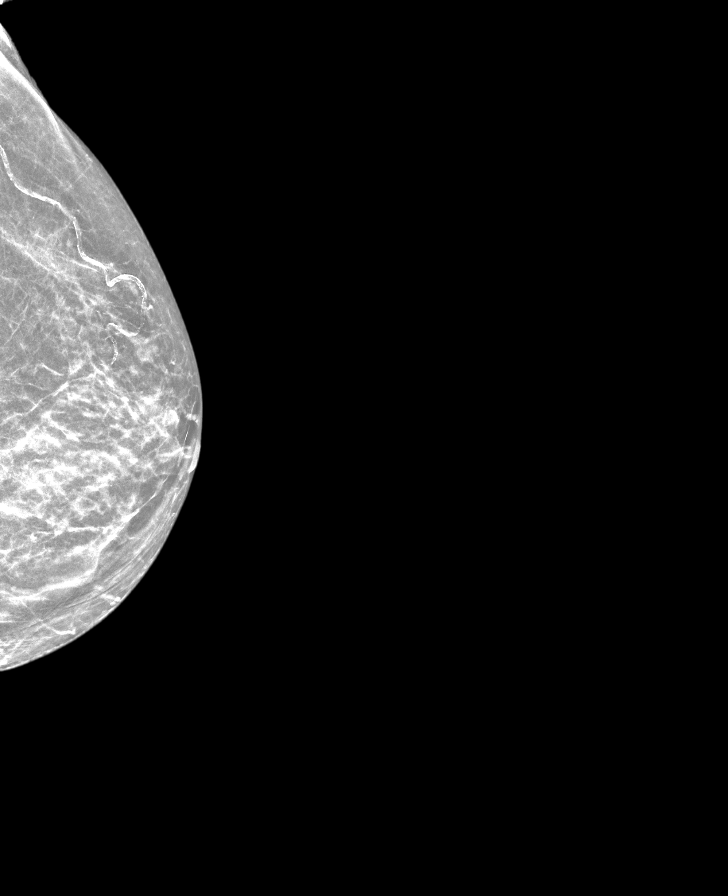

[L MLO synth-2D]
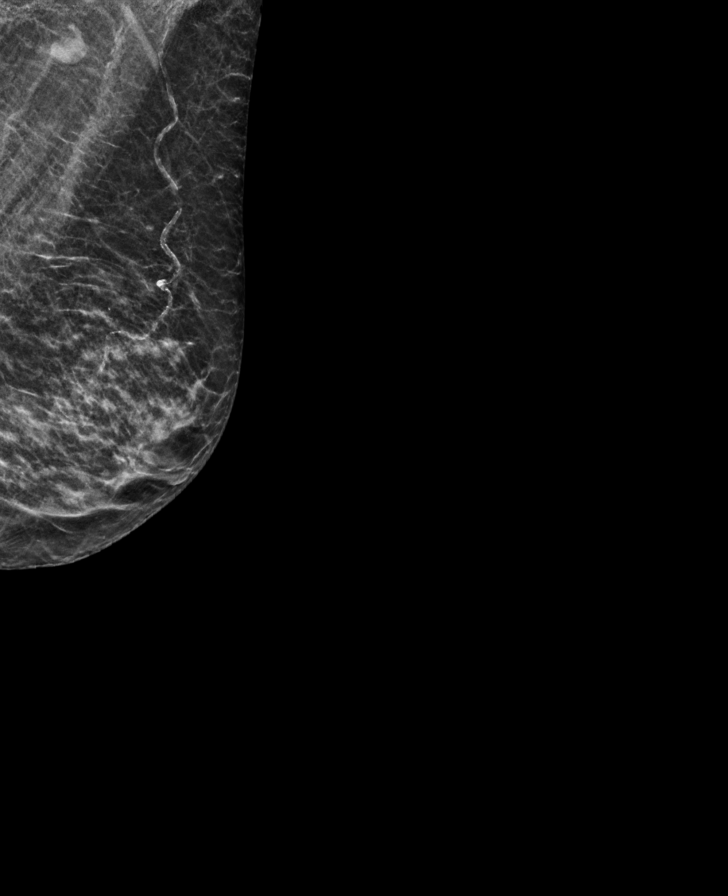

[R MLO tomo · tomo slice 21/42.0]
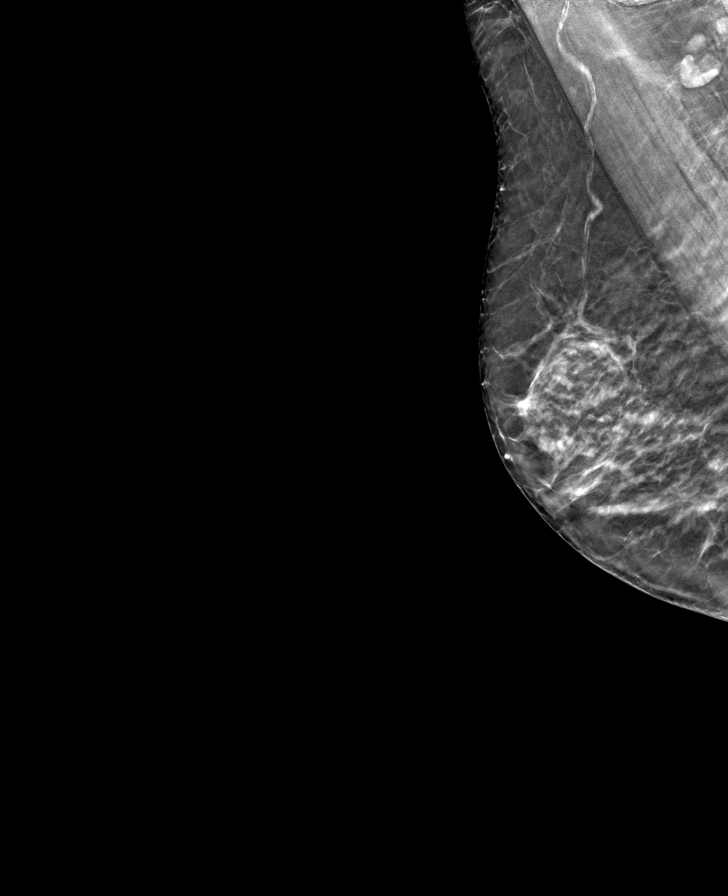

[R CC tomo · tomo slice 23/44.0]
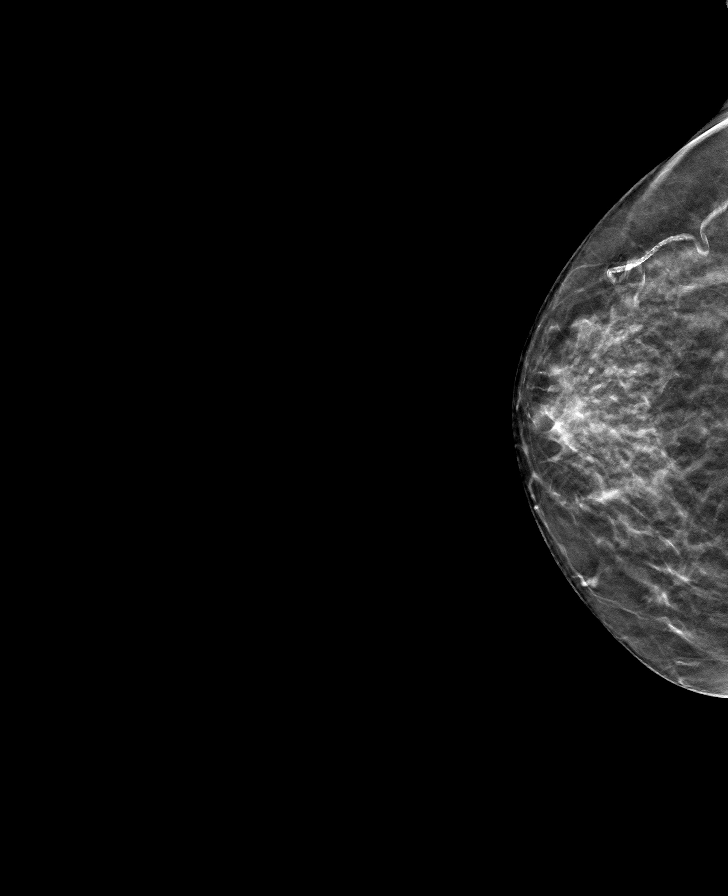

[L MLO tomo · tomo slice 21/40.0]
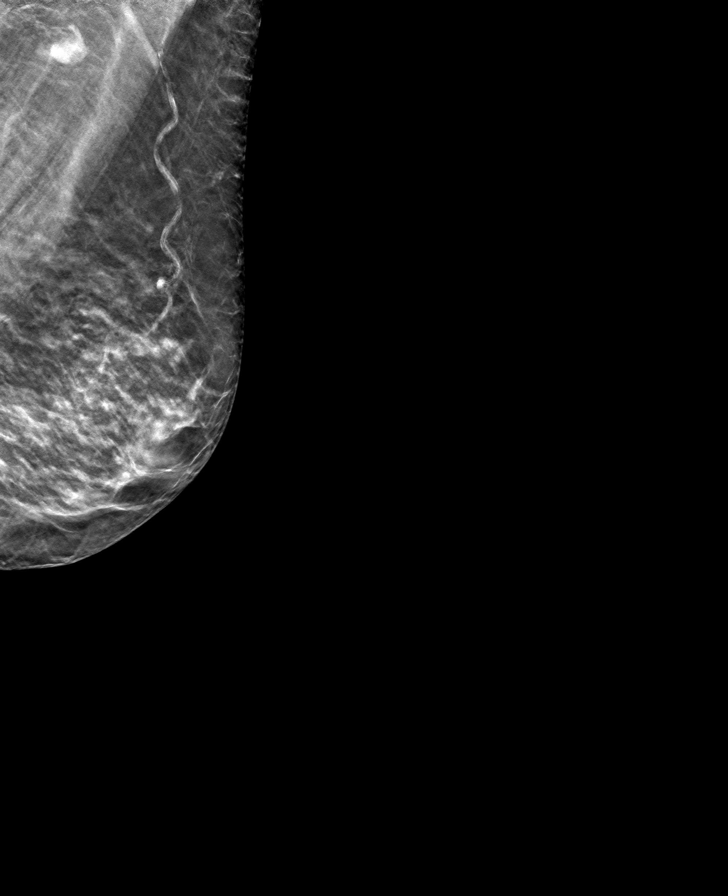

[L CC tomo · tomo slice 21/42.0]
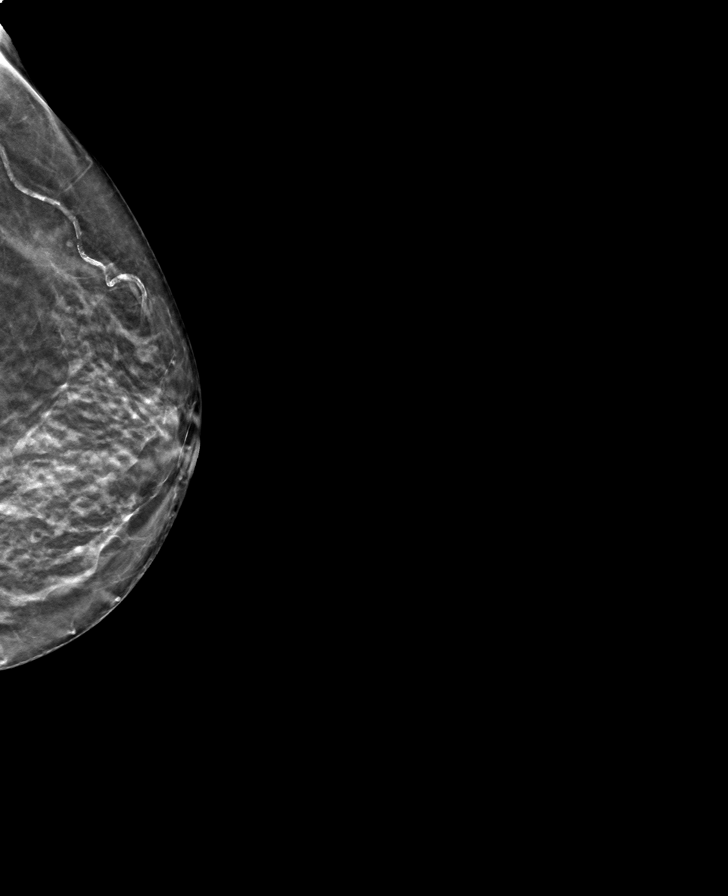

[8 of 24 positions shown; findings below may reference images not displayed]

ACR Breast Density Category c: The breast tissue is heterogeneously
dense, which may obscure small masses.
FINDINGS: There are no findings suspicious for malignancy. Images were
processed with CAD.
IMPRESSION: No mammographic evidence of malignancy. A result letter of this
screening mammogram will be mailed directly to the patient.

RECOMMENDATION:
Screening mammogram in one year. (Code:FT-U-LHB)

BI-RADS CATEGORY  1: Negative.

## 2022-05-11 ENCOUNTER — Other Ambulatory Visit: Payer: Self-pay

## 2022-05-11 MED ORDER — FUROSEMIDE 40 MG PO TABS: 40.0000 mg | ORAL_TABLET | Freq: Every day | ORAL | 0 refills | Status: DC

## 2022-05-15 DIAGNOSIS — H903 Sensorineural hearing loss, bilateral: Secondary | ICD-10-CM | POA: Diagnosis not present

## 2022-05-30 ENCOUNTER — Other Ambulatory Visit: Payer: Self-pay | Admitting: *Deleted

## 2022-05-30 MED ORDER — FUROSEMIDE 40 MG PO TABS: 40.0000 mg | ORAL_TABLET | Freq: Every day | ORAL | 1 refills | Status: DC

## 2022-06-19 ENCOUNTER — Ambulatory Visit
Admission: EM | Admit: 2022-06-19 | Discharge: 2022-06-19 | Disposition: A | Discharge status: Home / Self Care | Payer: PPO | Attending: Emergency Medicine | Admitting: Emergency Medicine

## 2022-06-19 ENCOUNTER — Ambulatory Visit (INDEPENDENT_AMBULATORY_CARE_PROVIDER_SITE_OTHER): Payer: PPO

## 2022-06-19 DIAGNOSIS — J181 Lobar pneumonia, unspecified organism: Secondary | ICD-10-CM | POA: Insufficient documentation

## 2022-06-19 DIAGNOSIS — R0602 Shortness of breath: Secondary | ICD-10-CM

## 2022-06-19 DIAGNOSIS — R051 Acute cough: Secondary | ICD-10-CM

## 2022-06-19 DIAGNOSIS — I509 Heart failure, unspecified: Secondary | ICD-10-CM | POA: Insufficient documentation

## 2022-06-19 DIAGNOSIS — I10 Essential (primary) hypertension: Secondary | ICD-10-CM

## 2022-06-19 DIAGNOSIS — I4819 Other persistent atrial fibrillation: Secondary | ICD-10-CM | POA: Insufficient documentation

## 2022-06-19 DIAGNOSIS — Z1152 Encounter for screening for COVID-19: Secondary | ICD-10-CM | POA: Diagnosis not present

## 2022-06-19 DIAGNOSIS — R059 Cough, unspecified: Secondary | ICD-10-CM | POA: Diagnosis not present

## 2022-06-19 DIAGNOSIS — I110 Hypertensive heart disease with heart failure: Secondary | ICD-10-CM | POA: Diagnosis not present

## 2022-06-19 LAB — RESP PANEL BY RT-PCR (RSV, FLU A&B, COVID)  RVPGX2
Influenza A by PCR: NEGATIVE
Influenza B by PCR: NEGATIVE
Resp Syncytial Virus by PCR: NEGATIVE
SARS Coronavirus 2 by RT PCR: NEGATIVE

## 2022-06-19 MED ORDER — BENZONATATE 100 MG PO CAPS: 100.0000 mg | ORAL_CAPSULE | Freq: Three times a day (TID) | ORAL | 0 refills | Status: DC | PRN

## 2022-06-19 NOTE — Discharge Instructions (Signed)
Your COVID, Flu, and RSV tests are pending.    Take the [facility] as directed.  Take Tylenol as needed for fever or discomfort.  Rest and keep yourself hydrated.    Follow-up with your primary care provider if your symptoms are not improving.    Your blood pressure is elevated today at 156/75.  Please have this rechecked by your primary care provider in 2-4 weeks.

## 2022-06-19 NOTE — ED Provider Notes (Signed)
Roderic Palau    CSN: [id number] Arrival date & time: 06/19/22  0846      History   Chief Complaint Chief Complaint  Patient presents with   Cough    HPI Nancy Lopez is a 81 y.o. female.  Accompanied by her grandson, patient presents with cough and shortness of breath x2 weeks.  The cough is mostly nonproductive.  The shortness of breath occurs with coughing episodes only.  She denies fever, chills, ear pain, sore throat, chest pain, vomiting, diarrhea, or symptoms.  OTC cough medications attempted.  Her medical history includes hypertension, atrial fibrillation, heart failure.   The history is provided by the patient, a relative and medical records.    Past Medical History:  Diagnosis Date   Arthritis    right knee   Chest pain    a. 04/2021 MV: EF 62%, no ischemia/infarct. No significant cor Ca2+.   Diastolic dysfunction    a. 01/2021 Echo: EF 55-60%, mild inflat HK, Gr1 DD, nl RV fxn. Mild to mod MR. Mod TR. Mild AI.   Diverticulosis    GERD (gastroesophageal reflux disease)    Hypertension    Osteopenia    Persistent atrial fibrillation (HCC)    a. Dx 01/2021 in the setting of PNA; b. CHA2DS2VASc 4-->eliquis.   Personal history of colonic polyps     Patient Active Problem List   Diagnosis Date Noted   Sepsis ([place]) 01/18/2021   CAP (community acquired pneumonia) 01/18/2021   Essential hypertension 01/18/2021   Anemia 01/18/2021   Lobar pneumonia ([place])    Atrial fibrillation with RVR ([place]) 01/17/2021    Past Surgical History:  Procedure Laterality Date   ABDOMINAL HYSTERECTOMY  1979   BREAST CYST ASPIRATION     CATARACT EXTRACTION     CHOLECYSTECTOMY     COLONOSCOPY WITH PROPOFOL N/A 09/06/2015   Procedure: COLONOSCOPY WITH PROPOFOL;  Surgeon: Hulen Luster, MD;  Location: [facility] ENDOSCOPY;  Service: Gastroenterology;  Laterality: N/A;   COLONOSCOPY WITH PROPOFOL N/A 11/10/2019   Procedure: COLONOSCOPY WITH PROPOFOL;  Surgeon: Toledo, Benay Pike, MD;   Location: [facility] ENDOSCOPY;  Service: Gastroenterology;  Laterality: N/A;   EXCISION METACARPAL MASS Right 08/31/2016   Procedure: EXCISION METACARPAL MASS;  Surgeon: Hessie Knows, MD;  Location: [facility] ORS;  Service: Orthopedics;  Laterality: Right;   HERNIA REPAIR      OB History   No obstetric history on file.      Home Medications    Prior to Admission medications   Medication Sig Start Date End Date Taking? Authorizing Provider  benzonatate (TESSALON) 100 MG capsule Take 1 capsule (100 mg total) by mouth 3 (three) times daily as needed for cough. 06/19/22  Yes Sharion Balloon, NP  apixaban (ELIQUIS) 5 MG TABS tablet Take 1 tablet (5 mg total) by mouth 2 (two) times daily. 03/01/22   Kate Sable, MD  citalopram (CELEXA) 10 MG tablet Take 10 mg by mouth daily. 04/26/21   [provider]  diltiazem (CARDIZEM CD) 180 MG 24 hr capsule Take 1 capsule (180 mg total) by mouth daily. 03/03/22 04/02/22  Kate Sable, MD  furosemide (LASIX) 40 MG tablet Take 1 tablet (40 mg total) by mouth daily. 05/30/22   Kate Sable, MD  gatifloxacin (ZYMAXID) 0.5 % SOLN Place 1 drop into the left eye 4 (four) times daily.    [provider]  ketorolac (ACULAR) 0.5 % ophthalmic solution Place 1 drop into the left eye 4 (four) times daily.  [provider]  KLOR-CON M10 10 MEQ tablet TAKE 1 TABLET BY MOUTH EVERY DAY 03/01/22   Theora Gianotti, NP  metoprolol succinate (TOPROL-XL) 100 MG 24 hr tablet Take 100 mg by mouth daily. Take with or immediately following a meal.    [provider]  prednisoLONE acetate (PRED FORTE) 1 % ophthalmic suspension Place 1 drop into the left eye 4 (four) times daily.    [provider]    Family History Family History  Problem Relation Age of Onset   Breast cancer Neg Hx     Social History Social History   Tobacco Use   Smoking status: Never   Smokeless tobacco: Never  Vaping Use   Vaping Use: Never  used  Substance Use Topics   Alcohol use: No   Drug use: No     Allergies   Patient has no known allergies.   Review of Systems Review of Systems  Constitutional:  Negative for chills and fever.  HENT:  Negative for ear pain and sore throat.   Respiratory:  Positive for cough and shortness of breath.   Cardiovascular:  Negative for chest pain and palpitations.  Gastrointestinal:  Negative for diarrhea and vomiting.  Skin:  Negative for color change and rash.  All other systems reviewed and are negative.    Physical Exam Triage Vital Signs ED Triage Vitals  Enc Vitals Group     BP      Pulse      Resp      Temp      Temp src      SpO2      Weight      Height      Head Circumference      Peak Flow      Pain Score      Pain Loc      Pain Edu?      Excl. in [place]?    No data found.  Updated Vital Signs BP (!) 138/90   Pulse 95   Temp 98.2 F (36.8 C)   Resp 18   Ht '5\' 3"'$  (1.6 m)   Wt 150 lb (68 kg)   SpO2 97%   BMI 26.57 kg/m   Visual Acuity Right Eye Distance:   Left Eye Distance:   Bilateral Distance:    Right Eye Near:   Left Eye Near:    Bilateral Near:     Physical Exam Vitals and nursing note reviewed.  Constitutional:      General: She is not in acute distress.    Appearance: She is well-developed. She is not ill-appearing.  HENT:     Head: Normocephalic and atraumatic.     Right Ear: Tympanic membrane normal.     Left Ear: Tympanic membrane normal.     Nose: Nose normal.     Mouth/Throat:     Mouth: Mucous membranes are moist.     Pharynx: Oropharynx is clear.  Cardiovascular:     Rate and Rhythm: Normal rate. Rhythm irregular.     Heart sounds: Normal heart sounds.  Pulmonary:     Effort: Pulmonary effort is normal. No respiratory distress.     Breath sounds: Normal breath sounds.  Musculoskeletal:     Cervical back: Neck supple.  Skin:    General: Skin is warm and dry.  Neurological:     Mental Status: She is alert.   Psychiatric:        Mood and Affect: Mood normal.  Behavior: Behavior normal.      UC Treatments / Results  Labs (all labs ordered are listed, but only abnormal results are displayed) Labs Reviewed  RESP PANEL BY RT-PCR (RSV, FLU A&B, COVID)  RVPGX2    EKG   Radiology DG Chest 2 View  Result Date: 06/19/2022 CLINICAL DATA:  Urgent Care with complaints of cough x14 days. Reports cough is dry. Reports trying multiple OTC medications and "remedies" without relief. EXAM: CHEST - 2 VIEW COMPARISON:  01/17/2021 FINDINGS: Lungs are clear. Heart size upper limits normal. No effusion. Visualized bones unremarkable.  Cholecystectomy clips. IMPRESSION: No acute cardiopulmonary disease. Electronically Signed   By: Lucrezia Europe M.D.   On: 06/19/2022 09:30    Procedures Procedures (including critical care time)  Medications Ordered in UC Medications - No data to display  Initial Impression / Assessment and Plan / UC Course  I have reviewed the triage vital signs and the nursing notes.  Pertinent labs & imaging results that were available during my care of the patient were reviewed by me and considered in my medical decision making (see chart for details).    Cough, shortness of breath, elevated blood pressure reading with hypertension.  Chest x-ray negative.  COVID, Flu, RSV pending.  Discussed symptomatic treatment including Tessalon Perles, Tylenol, rest, hydration.  Instructed patient to follow up with her PCP if her symptoms are not improving.  She agrees to plan of care.   Final Clinical Impressions(s) / UC Diagnoses   Final diagnoses:  Acute cough  Shortness of breath  Elevated blood pressure reading in office with diagnosis of hypertension     Discharge Instructions      Your COVID, Flu, and RSV tests are pending.    Take the [facility] as directed.  Take Tylenol as needed for fever or discomfort.  Rest and keep yourself hydrated.    Follow-up with your  primary care provider if your symptoms are not improving.    Your blood pressure is elevated today at 156/75.  Please have this rechecked by your primary care provider in 2-4 weeks.          ED Prescriptions     Medication Sig Dispense Auth. Provider   benzonatate (TESSALON) 100 MG capsule Take 1 capsule (100 mg total) by mouth 3 (three) times daily as needed for cough. 21 capsule Sharion Balloon, NP      PDMP not reviewed this encounter.   Sharion Balloon, NP 06/19/22 [phone]

## 2022-06-19 NOTE — ED Triage Notes (Signed)
Patient to Urgent Care with complaints of cough x14 days. Reports cough is dry. Reports trying multiple OTC medications and "remedies" without relief. Denies any known fevers or any other symptoms.

## 2022-07-10 ENCOUNTER — Telehealth: Payer: Self-pay | Admitting: Cardiology

## 2022-07-10 ENCOUNTER — Other Ambulatory Visit: Payer: Self-pay | Admitting: Nurse Practitioner

## 2022-07-10 MED ORDER — DILTIAZEM HCL ER COATED BEADS 180 MG PO CP24: 180.0000 mg | ORAL_CAPSULE | Freq: Every day | ORAL | 0 refills | Status: DC

## 2022-07-10 NOTE — Telephone Encounter (Signed)
  *  STAT* If patient is at the pharmacy, call can be transferred to refill team.   1. Which medications need to be refilled? (please list name of each medication and dose if known)   diltiazem (CARDIZEM CD) 180 MG 24 hr capsule     2. Which pharmacy/location (including street and city if local pharmacy) is medication to be sent to? [id number] [place], [place]- [address]. Do they need a 30 day or 90 day supply? 90 days

## 2022-07-10 NOTE — Telephone Encounter (Signed)
Requested Prescriptions   Signed Prescriptions Disp Refills   diltiazem (CARDIZEM CD) 180 MG 24 hr capsule 90 capsule 0    Sig: Take 1 capsule (180 mg total) by mouth daily.    Authorizing Provider: Kate Sable    Ordering User: Raelene Bott, Timberlynn Kizziah L

## 2022-08-21 NOTE — Progress Notes (Signed)
Cardiology Office Note    Date:  09/01/2022   ID:  Nancy Lopez, DOB 27-Apr-1941, MRN [id number]  PCP:  [place] Pink, MD  Cardiologist:  Kate Sable, MD  Electrophysiologist:  None   Chief Complaint: Follow-up  History of Present Illness:   Nancy Lopez is a 81 y.o. female with history of permanent A-fib on apixaban, HTN, HFpEF, and GERD who presents for follow-up of A-fib and HFpEF.   She was admitted to the hospital in 01/2021 with chest pain that radiated down both arms.  She was noted to be in new onset A-fib with RVR in the context of CAP.  High-sensitivity troponin was negative.  She was rate controlled with metoprolol and Cardizem.  Echo showed an EF of 55 to 60%, mild inferolateral hypokinesis, grade 1 diastolic dysfunction, and mild to moderate mitral regurgitation.  Rate control strategy was pursued.  She has previously required addition of furosemide due to volume overload and lower extremity swelling.  Lexiscan MPI in 04/2021 showed no evidence of ischemia or significant coronary artery calcification.  LVEF was 55 to 65%.  Overall, this was a low risk scan.  She was seen in the office in 09/2021 with some progression of lower extremity edema in the context of holding her furosemide for a funeral.  She was advised to take Lasix 40 mg twice daily for 2 days then return to 40 mg daily thereafter.  She was last seen in the office in 02/2022 and was without symptoms of angina or decompensation.  No changes were indicated at that time.  She comes in doing well from a cardiac perspective and is without symptoms of angina or decompensation.  She does note some intermittent exertional shortness of breath if she becomes quite active.  This shortness of breath is not constant and is not associated with chest pain.  No dizziness, presyncope, or syncope.  No lower extremity swelling, progressive orthopnea, or early satiety.  No falls, hematochezia, or melena.  Her blood pressure is  elevated in the office today, though she has not yet taken any medications today.   Labs independently reviewed: 02/2022 - Hgb 12.0, PLT 233, potassium 3.9, BUN 13, serum creatinine 0.61 03/2021 - albumin 3.9, AST/ALT normal 01/2021 - TC 145, TG 142, HDL 36, LDL 81, magnesium 2.2, TSH normal  Past Medical History:  Diagnosis Date   Arthritis    right knee   Chest pain    a. 04/2021 MV: EF 62%, no ischemia/infarct. No significant cor Ca2+.   Diastolic dysfunction    a. 01/2021 Echo: EF 55-60%, mild inflat HK, Gr1 DD, nl RV fxn. Mild to mod MR. Mod TR. Mild AI.   Diverticulosis    GERD (gastroesophageal reflux disease)    Hypertension    Osteopenia    Persistent atrial fibrillation (HCC)    a. Dx 01/2021 in the setting of PNA; b. CHA2DS2VASc 4-->eliquis.   Personal history of colonic polyps     Past Surgical History:  Procedure Laterality Date   ABDOMINAL HYSTERECTOMY  1979   BREAST CYST ASPIRATION     CATARACT EXTRACTION     CHOLECYSTECTOMY     COLONOSCOPY WITH PROPOFOL N/A 09/06/2015   Procedure: COLONOSCOPY WITH PROPOFOL;  Surgeon: Hulen Luster, MD;  Location: [facility] ENDOSCOPY;  Service: Gastroenterology;  Laterality: N/A;   COLONOSCOPY WITH PROPOFOL N/A 11/10/2019   Procedure: COLONOSCOPY WITH PROPOFOL;  Surgeon: Toledo, Benay Pike, MD;  Location: [facility] ENDOSCOPY;  Service: Gastroenterology;  Laterality: N/A;  EXCISION METACARPAL MASS Right 08/31/2016   Procedure: EXCISION METACARPAL MASS;  Surgeon: Hessie Knows, MD;  Location: [facility] ORS;  Service: Orthopedics;  Laterality: Right;   HERNIA REPAIR      Current Medications: Current Meds  Medication Sig   apixaban (ELIQUIS) 5 MG TABS tablet Take 1 tablet (5 mg total) by mouth 2 (two) times daily.   citalopram (CELEXA) 10 MG tablet Take 10 mg by mouth daily.   diltiazem (CARDIZEM CD) 240 MG 24 hr capsule Take 1 capsule (240 mg total) by mouth daily.   furosemide (LASIX) 40 MG tablet Take 1 tablet (40 mg total) by mouth daily.    gatifloxacin (ZYMAXID) 0.5 % SOLN Place 1 drop into the left eye 4 (four) times daily.   ketorolac (ACULAR) 0.5 % ophthalmic solution Place 1 drop into the left eye 4 (four) times daily.   metoprolol succinate (TOPROL-XL) 100 MG 24 hr tablet Take 1 tablet (100 mg total) by mouth daily.   [DISCONTINUED] diltiazem (CARDIZEM CD) 180 MG 24 hr capsule Take 1 capsule (180 mg total) by mouth daily.    Allergies:   Patient has no known allergies.   Social History   Socioeconomic History   Marital status: Widowed    Spouse name: Not on file   Number of children: Not on file   Years of education: Not on file   Highest education level: Not on file  Occupational History   Not on file  Tobacco Use   Smoking status: Never   Smokeless tobacco: Never  Vaping Use   Vaping Use: Never used  Substance and Sexual Activity   Alcohol use: No   Drug use: No   Sexual activity: Never  Other Topics Concern   Not on file  Social History Narrative   Not on file   Social Determinants of Health   Financial Resource Strain: Not on file  Food Insecurity: Not on file  Transportation Needs: Not on file  Physical Activity: Not on file  Stress: Not on file  Social Connections: Not on file     Family History:  The patient's family history is negative for Breast cancer.  ROS:   12-point review of systems is negative unless otherwise noted in the HPI.   EKGs/Labs/Other Studies Reviewed:    Studies reviewed were summarized above. The additional studies were reviewed today:  Lexiscan MPI 05/18/2021:   The study is normal. The study is low risk.   No ST deviation was noted.   Nuclear stress EF: 62 %. The left ventricular ejection fraction is normal (55-65%). Left ventricular function is normal.   There is no evidence for ischemia   No significant coronary artery calcifications noted __________   2D echo 01/18/2021: 1. Mild Inferior Lateral Hypo.   2. Mild inferior lateral hypo. Left ventricular  ejection fraction, by  estimation, is 55 to 60%. The left ventricle has normal function. The left  ventricle demonstrates regional wall motion abnormalities (see scoring  diagram/findings for description).  Left ventricular diastolic parameters are consistent with Grade I  diastolic dysfunction (impaired relaxation).   3. Right ventricular systolic function is normal. The right ventricular  size is normal.   4. The mitral valve is abnormal. Mild to moderate mitral valve  regurgitation.   5. Tricuspid valve regurgitation is moderate.   6. The aortic valve is normal in structure. Aortic valve regurgitation is mild.   EKG:  EKG is ordered today.  The EKG ordered today demonstrates A-fib, 89 bpm,  rare aberrancy, baseline artifact, no acute ST-T changes  Recent Labs: 03/08/2022: BUN 13; Creatinine, Ser 0.61; Hemoglobin 12.0; Platelets 233; Potassium 3.9; Sodium 141  Recent Lipid Panel No results found for: "CHOL", "TRIG", "HDL", "CHOLHDL", "VLDL", "LDLCALC", "LDLDIRECT"  PHYSICAL EXAM:    VS:  BP (!) 162/80 (BP Location: Left Arm, Patient Position: Sitting, Cuff Size: Normal)   Pulse 89   Ht '5\' 4"'$  (1.626 m)   Wt 157 lb 8 oz (71.4 kg)   SpO2 98%   BMI 27.03 kg/m   BMI: Body mass index is 27.03 kg/m.  Physical Exam Vitals reviewed.  Constitutional:      Appearance: She is well-developed.  HENT:     Head: Normocephalic and atraumatic.  Eyes:     General:        Right eye: No discharge.        Left eye: No discharge.  Neck:     Vascular: No JVD.  Cardiovascular:     Rate and Rhythm: Normal rate. Rhythm irregularly irregular.     Heart sounds: Normal heart sounds, S1 normal and S2 normal. Heart sounds not distant. No midsystolic click and no opening snap. No murmur heard.    No friction rub.  Pulmonary:     Effort: Pulmonary effort is normal. No respiratory distress.     Breath sounds: Normal breath sounds. No decreased breath sounds, wheezing or rales.  Chest:     Chest  wall: No tenderness.  Abdominal:     General: There is no distension.  Musculoskeletal:     Cervical back: Normal range of motion.  Skin:    General: Skin is warm and dry.     Nails: There is no clubbing.  Neurological:     Mental Status: She is alert and oriented to person, place, and time.  Psychiatric:        Speech: Speech normal.        Behavior: Behavior normal.        Thought Content: Thought content normal.        Judgment: Judgment normal.     Wt Readings from Last 3 Encounters:  09/01/22 157 lb 8 oz (71.4 kg)  06/19/22 150 lb (68 kg)  03/08/22 155 lb (70.3 kg)     ASSESSMENT & PLAN:   HFpEF: Euvolemic and well compensated.  Weight stable.  She remains on furosemide 40 mg daily.  Permanent A-fib: Ventricular rates are reasonably controlled in the office today.  However, I do wonder if she is having paroxysms of RVR contributing to her intermittent exertional shortness of breath if she is quite active.  We will undergo a trial of increasing Cardizem CD to 240 mg daily with continuation of Toprol-XL 100 mg.  She is euvolemic.  CHA2DS2-VASc at least 5 (CHF, HTN, age x 2, sex category).  She remains on apixaban 5 mg twice daily and does not meet reduced dosing criteria.  No symptoms concerning for bleeding.  Recent labs stable.  HTN: Blood pressure is elevated in the office, though she has not yet taken her medications.  We therefore did not repeat her blood pressure.  We did titrate diltiazem as outlined above with continuation of metoprolol.  Continue to monitor.    Disposition: F/u with Dr. Garen Lah or an APP in 6 months.   Medication Adjustments/Labs and Tests Ordered: Current medicines are reviewed at length with the patient today.  Concerns regarding medicines are outlined above. Medication changes, Labs and Tests ordered today are summarized  above and listed in the Patient Instructions accessible in Encounters.   Signed, Christell Faith, PA-C 09/01/2022 5:15 PM      [facility] [address] [place] [address] [place], [place] [address] [phone]

## 2022-09-01 ENCOUNTER — Ambulatory Visit: Payer: PPO | Attending: Physician Assistant | Admitting: Physician Assistant

## 2022-09-01 ENCOUNTER — Encounter: Payer: Self-pay | Admitting: Physician Assistant

## 2022-09-01 VITALS — BP 162/80 | HR 89 | Ht 64.0 in | Wt 157.5 lb

## 2022-09-01 DIAGNOSIS — I10 Essential (primary) hypertension: Secondary | ICD-10-CM | POA: Diagnosis not present

## 2022-09-01 DIAGNOSIS — I4821 Permanent atrial fibrillation: Secondary | ICD-10-CM

## 2022-09-01 DIAGNOSIS — I5032 Chronic diastolic (congestive) heart failure: Secondary | ICD-10-CM

## 2022-09-01 MED ORDER — DILTIAZEM HCL ER COATED BEADS 240 MG PO CP24: 240.0000 mg | ORAL_CAPSULE | Freq: Every day | ORAL | 3 refills | Status: DC

## 2022-09-01 NOTE — Patient Instructions (Addendum)
Medication Instructions:  Your physician has recommended you make the following change in your medication:   INCREASE Cardizem CD to 240 mg once daily   *If you need a refill on your cardiac medications before your next appointment, please call your pharmacy*   Lab Work: None  If you have labs (blood work) drawn today and your tests are completely normal, you will receive your results only by: [place] (if you have MyChart) OR A paper copy in the mail If you have any lab test that is abnormal or we need to change your treatment, we will call you to review the results.   Testing/Procedures: None   Follow-Up: At [facility], you and your health needs are our priority.  As part of our continuing mission to provide you with exceptional heart care, we have created designated Provider Care Teams.  These Care Teams include your primary Cardiologist (physician) and Advanced Practice Providers (APPs -  Physician Assistants and Nurse Practitioners) who all work together to provide you with the care you need, when you need it.   Your next appointment:   6 month(s)  The format for your next appointment:   In Person  Provider:   Kate Sable, MD or Christell Faith, PA-C      Important Information About Sugar

## 2022-09-14 ENCOUNTER — Ambulatory Visit: Payer: PPO | Admitting: Physician Assistant

## 2022-09-18 ENCOUNTER — Other Ambulatory Visit: Payer: Self-pay | Admitting: Physician Assistant

## 2022-10-13 ENCOUNTER — Other Ambulatory Visit: Payer: Self-pay | Admitting: Physician Assistant

## 2022-10-24 DIAGNOSIS — I4821 Permanent atrial fibrillation: Secondary | ICD-10-CM | POA: Diagnosis not present

## 2022-10-24 DIAGNOSIS — I5032 Chronic diastolic (congestive) heart failure: Secondary | ICD-10-CM | POA: Diagnosis not present

## 2022-10-24 DIAGNOSIS — I10 Essential (primary) hypertension: Secondary | ICD-10-CM | POA: Diagnosis not present

## 2022-10-24 DIAGNOSIS — R053 Chronic cough: Secondary | ICD-10-CM | POA: Diagnosis not present

## 2022-10-24 DIAGNOSIS — N1832 Chronic kidney disease, stage 3b: Secondary | ICD-10-CM | POA: Diagnosis not present

## 2022-10-24 DIAGNOSIS — Z0000 Encounter for general adult medical examination without abnormal findings: Secondary | ICD-10-CM | POA: Diagnosis not present

## 2022-10-25 DIAGNOSIS — I10 Essential (primary) hypertension: Secondary | ICD-10-CM | POA: Diagnosis not present

## 2022-10-26 DIAGNOSIS — R8290 Unspecified abnormal findings in urine: Secondary | ICD-10-CM | POA: Diagnosis not present

## 2022-11-02 DIAGNOSIS — Z961 Presence of intraocular lens: Secondary | ICD-10-CM | POA: Diagnosis not present

## 2022-11-09 DIAGNOSIS — R0602 Shortness of breath: Secondary | ICD-10-CM | POA: Diagnosis not present

## 2022-11-24 DIAGNOSIS — E876 Hypokalemia: Secondary | ICD-10-CM | POA: Diagnosis not present

## 2022-12-26 DIAGNOSIS — J449 Chronic obstructive pulmonary disease, unspecified: Secondary | ICD-10-CM | POA: Diagnosis not present

## 2023-01-15 ENCOUNTER — Other Ambulatory Visit: Payer: Self-pay

## 2023-01-15 MED ORDER — FUROSEMIDE 40 MG PO TABS: 40.0000 mg | ORAL_TABLET | Freq: Every day | ORAL | 0 refills | Status: DC

## 2023-03-05 ENCOUNTER — Encounter: Payer: Self-pay | Admitting: Cardiology

## 2023-03-05 ENCOUNTER — Ambulatory Visit: Payer: PPO | Attending: Cardiology | Admitting: Cardiology

## 2023-03-05 VITALS — BP 122/60 | HR 54 | Ht 64.0 in | Wt 158.6 lb

## 2023-03-05 DIAGNOSIS — I4821 Permanent atrial fibrillation: Secondary | ICD-10-CM

## 2023-03-05 DIAGNOSIS — I10 Essential (primary) hypertension: Secondary | ICD-10-CM | POA: Diagnosis not present

## 2023-03-05 DIAGNOSIS — I5032 Chronic diastolic (congestive) heart failure: Secondary | ICD-10-CM | POA: Diagnosis not present

## 2023-03-05 MED ORDER — APIXABAN 5 MG PO TABS: 5.0000 mg | ORAL_TABLET | Freq: Two times a day (BID) | ORAL | 3 refills | Status: AC

## 2023-03-05 NOTE — Patient Instructions (Signed)
Medication Instructions:   Your physician recommends that you continue on your current medications as directed. Please refer to the Current Medication list given to you today.  *If you need a refill on your cardiac medications before your next appointment, please call your pharmacy*   Lab Work:  None Ordered  If you have labs (blood work) drawn today and your tests are completely normal, you will receive your results only by: MyChart Message (if you have MyChart) OR A paper copy in the mail If you have any lab test that is abnormal or we need to change your treatment, we will call you to review the results.   Testing/Procedures:  None Ordered    Follow-Up: At [facility], you and your health needs are our priority.  As part of our continuing mission to provide you with exceptional heart care, we have created designated Provider Care Teams.  These Care Teams include your primary Cardiologist (physician) and Advanced Practice Providers (APPs -  Physician Assistants and Nurse Practitioners) who all work together to provide you with the care you need, when you need it.  We recommend signing up for the patient portal called "MyChart".  Sign up information is provided on this After Visit Summary.  MyChart is used to connect with patients for Virtual Visits (Telemedicine).  Patients are able to view lab/test results, encounter notes, upcoming appointments, etc.  Non-urgent messages can be sent to your provider as well.   To learn more about what you can do with MyChart, go to [email or web address].    Your next appointment:   12 month(s)  Provider:   You may see Brian Agbor-Etang, MD or one of the following Advanced Practice Providers on your designated Care Team:   Christopher Berge, NP Ryan Dunn, PA-C Cadence Furth, PA-C Sheri Hammock, NP  

## 2023-03-05 NOTE — Progress Notes (Signed)
Cardiology Office Note:    Date:  03/05/2023   ID:  Nancy Lopez, DOB 1940/11/29, MRN [id number]  PCP:  Jerl Mina, MD   [facility] Providers Cardiologist:  Debbe Odea, MD     Referring MD: Jerl Mina, MD   Chief Complaint  Patient presents with   Follow-up    Patient denies new or acute cardiac problems/concerns today.  Needing a printed prescription for Eliquis for patient's daughter to submit to Patient assistance program.    History of Present Illness:    Nancy Lopez is a 82 y.o. female with a hx of hypertension, HFpEF, permanent A. fib, GERD who presents for follow-up.  Feels well, denies chest pain, palpitations, leg edema.  Compliant with medications including Eliquis as prescribed.  Denies any bleeding issues.  Has no concerns at this time, very happy with current management plan.  Prior notes Echocardiogram 01/18/2021 showed normal systolic function, EF 55 to 60%, severely dilated left atrium, mild to moderate MR.  Past Medical History:  Diagnosis Date   Arthritis    right knee   Chest pain    a. 04/2021 MV: EF 62%, no ischemia/infarct. No significant cor Ca2+.   Diastolic dysfunction    a. 01/2021 Echo: EF 55-60%, mild inflat HK, Gr1 DD, nl RV fxn. Mild to mod MR. Mod TR. Mild AI.   Diverticulosis    GERD (gastroesophageal reflux disease)    Hypertension    Osteopenia    Persistent atrial fibrillation (HCC)    a. Dx 01/2021 in the setting of PNA; b. CHA2DS2VASc 4-->eliquis.   Personal history of colonic polyps     Past Surgical History:  Procedure Laterality Date   ABDOMINAL HYSTERECTOMY  1979   BREAST CYST ASPIRATION     CATARACT EXTRACTION     CHOLECYSTECTOMY     COLONOSCOPY WITH PROPOFOL N/A 09/06/2015   Procedure: COLONOSCOPY WITH PROPOFOL;  Surgeon: Wallace Cullens, MD;  Location[facility] ENDOSCOPY;  Service: Gastroenterology;  Laterality: N/A;   COLONOSCOPY WITH PROPOFOL N/A 11/10/2019   Procedure: COLONOSCOPY WITH PROPOFOL;   Surgeon: Toledo, Boykin Nearing, MD;  Location: [facility] ENDOSCOPY;  Service: Gastroenterology;  Laterality: N/A;   EXCISION METACARPAL MASS Right 08/31/2016   Procedure: EXCISION METACARPAL MASS;  Surgeon: Kennedy Bucker, MD;  Location: [facility] ORS;  Service: Orthopedics;  Laterality: Right;   HERNIA REPAIR      Current Medications: Current Meds  Medication Sig   ascorbic acid (VITAMIN C) 500 MG tablet Take 500 mg by mouth daily.   Cholecalciferol (D 1000) 25 MCG (1000 UT) capsule Take 1,000 Units by mouth daily.   citalopram (CELEXA) 10 MG tablet Take 10 mg by mouth daily.   diltiazem (CARDIZEM CD) 240 MG 24 hr capsule Take 1 capsule (240 mg total) by mouth daily.   furosemide (LASIX) 40 MG tablet Take 1 tablet (40 mg total) by mouth daily.   metoprolol succinate (TOPROL-XL) 100 MG 24 hr tablet TAKE 1 TABLET BY MOUTH EVERY DAY   Multiple Vitamins-Minerals (CENTRUM SILVER PO) Take 1 tablet by mouth daily.   TRELEGY ELLIPTA 100-62.5-25 MCG/ACT AEPB Inhale 1 puff into the lungs daily.   [DISCONTINUED] apixaban (ELIQUIS) 5 MG TABS tablet Take 1 tablet (5 mg total) by mouth 2 (two) times daily.     Allergies:   Patient has no known allergies.   Social History   Socioeconomic History   Marital status: Widowed    Spouse name: Not on file   Number of children: Not on  file   Years of education: Not on file   Highest education level: Not on file  Occupational History   Not on file  Tobacco Use   Smoking status: Never   Smokeless tobacco: Never  Vaping Use   Vaping Use: Never used  Substance and Sexual Activity   Alcohol use: No   Drug use: No   Sexual activity: Never  Other Topics Concern   Not on file  Social History Narrative   Not on file   Social Determinants of Health   Financial Resource Strain: Not on file  Food Insecurity: Not on file  Transportation Needs: Not on file  Physical Activity: Not on file  Stress: Not on file  Social Connections: Not on file     Family  History: The patient's family history is negative for Breast cancer.  ROS:   Please see the history of present illness.     All other systems reviewed and are negative.  EKGs/Labs/Other Studies Reviewed:    The following studies were reviewed today:   EKG:  EKG is  ordered today.  The ekg ordered today demonstrates atrial fibrillation, heart rate 54  Recent Labs: 03/08/2022: BUN 13; Creatinine, Ser 0.61; Hemoglobin 12.0; Platelets 233; Potassium 3.9; Sodium 141  Recent Lipid Panel No results found for: "CHOL", "TRIG", "HDL", "CHOLHDL", "VLDL", "LDLCALC", "LDLDIRECT"   Risk Assessment/Calculations:      Physical Exam:    VS:  BP 122/60 (BP Location: Left Arm, Patient Position: Sitting, Cuff Size: Normal)   Pulse (!) 54   Ht 5\' 4"  (1.626 m)   Wt 158 lb 9.6 oz (71.9 kg)   SpO2 94%   BMI 27.22 kg/m     Wt Readings from Last 3 Encounters:  03/05/23 158 lb 9.6 oz (71.9 kg)  09/01/22 157 lb 8 oz (71.4 kg)  06/19/22 150 lb (68 kg)     GEN:  Well nourished, well developed in no acute distress HEENT: Normal NECK: No JVD; No carotid bruits CARDIAC: Irregularly irregular, nontachycardic. RESPIRATORY:  Clear to auscultation without rales, wheezing or rhonchi  ABDOMEN: Soft, non-tender, non-distended MUSCULOSKELETAL: No deformity, mild nonpitting lymphedema SKIN: Warm and dry NEUROLOGIC:  Alert and oriented x 3 PSYCHIATRIC:  Normal affect   ASSESSMENT:    1. Permanent atrial fibrillation (HCC)   2. Chronic heart failure with preserved ejection fraction (HFpEF) (HCC)   3. Primary hypertension      PLAN:    In order of problems listed above:  Permanent atrial fibrillation, CHA2DS2-VASc score 4 (age, htn, gender).  Heart rate controlled.  Continue Toprol-XL, Cardizem CD.  Eliquis 5 mg twice daily.  HFpEF, no edema, denies shortness of breath.  Continue Lasix 40 mg daily. Hypertension, BP controlled.  Continue Cardizem, Toprol-XL.  Follow-up in 12 months.    Medication Adjustments/Labs and Tests Ordered: Current medicines are reviewed at length with the patient today.  Concerns regarding medicines are outlined above.  Orders Placed This Encounter  Procedures   EKG 12-Lead   Meds ordered this encounter  Medications   apixaban (ELIQUIS) 5 MG TABS tablet    Sig: Take 1 tablet (5 mg total) by mouth 2 (two) times daily.    Dispense:  180 tablet    Refill:  3    Patient Instructions  Medication Instructions:   Your physician recommends that you continue on your current medications as directed. Please refer to the Current Medication list given to you today.  *If you need a refill on your cardiac  medications before your next appointment, please call your pharmacy*   Lab Work:  None Ordered  If you have labs (blood work) drawn today and your tests are completely normal, you will receive your results only by: MyChart Message (if you have MyChart) OR A paper copy in the mail If you have any lab test that is abnormal or we need to change your treatment, we will call you to review the results.   Testing/Procedures:  None Ordered   Follow-Up: At [facility], you and your health needs are our priority.  As part of our continuing mission to provide you with exceptional heart care, we have created designated Provider Care Teams.  These Care Teams include your primary Cardiologist (physician) and Advanced Practice Providers (APPs -  Physician Assistants and Nurse Practitioners) who all work together to provide you with the care you need, when you need it.  We recommend signing up for the patient portal called "MyChart".  Sign up information is provided on this After Visit Summary.  MyChart is used to connect with patients for Virtual Visits (Telemedicine).  Patients are able to view lab/test results, encounter notes, upcoming appointments, etc.  Non-urgent messages can be sent to your provider as well.   To learn more about what you  can do with MyChart, go to [email or web address].    Your next appointment:   12 month(s)  Provider:   You may see Debbe Odea, MD or one of the following Advanced Practice Providers on your designated Care Team:   Nicolasa Ducking, NP Eula Listen, PA-C Cadence Fransico Michael, PA-C Charlsie Quest, NP    Signed, Debbe Odea, MD  03/05/2023 3:33 PM    [facility]

## 2023-04-03 ENCOUNTER — Other Ambulatory Visit: Payer: Self-pay | Admitting: Physician Assistant

## 2023-04-03 MED ORDER — FUROSEMIDE 40 MG PO TABS: 40.0000 mg | ORAL_TABLET | Freq: Every day | ORAL | 3 refills | Status: DC

## 2023-04-12 ENCOUNTER — Telehealth: Payer: Self-pay

## 2023-04-12 NOTE — Telephone Encounter (Signed)
Left detailed message on VM - okay per DPR making patient aware she was approved for Eliquis patient assistance.

## 2023-06-27 DIAGNOSIS — J432 Centrilobular emphysema: Secondary | ICD-10-CM | POA: Diagnosis not present

## 2023-06-27 DIAGNOSIS — J449 Chronic obstructive pulmonary disease, unspecified: Secondary | ICD-10-CM | POA: Diagnosis not present

## 2023-08-20 ENCOUNTER — Other Ambulatory Visit: Payer: Self-pay | Admitting: Physician Assistant

## 2023-10-29 DIAGNOSIS — I5032 Chronic diastolic (congestive) heart failure: Secondary | ICD-10-CM | POA: Diagnosis not present

## 2023-10-29 DIAGNOSIS — J432 Centrilobular emphysema: Secondary | ICD-10-CM | POA: Diagnosis not present

## 2023-10-29 DIAGNOSIS — Z780 Asymptomatic menopausal state: Secondary | ICD-10-CM | POA: Diagnosis not present

## 2023-10-29 DIAGNOSIS — I4821 Permanent atrial fibrillation: Secondary | ICD-10-CM | POA: Diagnosis not present

## 2023-10-29 DIAGNOSIS — Z0000 Encounter for general adult medical examination without abnormal findings: Secondary | ICD-10-CM | POA: Diagnosis not present

## 2023-10-29 DIAGNOSIS — I10 Essential (primary) hypertension: Secondary | ICD-10-CM | POA: Diagnosis not present

## 2023-10-29 DIAGNOSIS — E785 Hyperlipidemia, unspecified: Secondary | ICD-10-CM | POA: Diagnosis not present

## 2023-10-29 DIAGNOSIS — Z1331 Encounter for screening for depression: Secondary | ICD-10-CM | POA: Diagnosis not present

## 2023-10-29 DIAGNOSIS — M8580 Other specified disorders of bone density and structure, unspecified site: Secondary | ICD-10-CM | POA: Diagnosis not present

## 2023-10-29 DIAGNOSIS — N1832 Chronic kidney disease, stage 3b: Secondary | ICD-10-CM | POA: Diagnosis not present

## 2023-10-30 DIAGNOSIS — E785 Hyperlipidemia, unspecified: Secondary | ICD-10-CM | POA: Diagnosis not present

## 2023-10-30 DIAGNOSIS — I10 Essential (primary) hypertension: Secondary | ICD-10-CM | POA: Diagnosis not present

## 2023-11-02 DIAGNOSIS — R8290 Unspecified abnormal findings in urine: Secondary | ICD-10-CM | POA: Diagnosis not present

## 2023-11-05 DIAGNOSIS — M810 Age-related osteoporosis without current pathological fracture: Secondary | ICD-10-CM | POA: Diagnosis not present

## 2024-02-12 DIAGNOSIS — J069 Acute upper respiratory infection, unspecified: Secondary | ICD-10-CM | POA: Diagnosis not present

## 2024-02-12 DIAGNOSIS — R053 Chronic cough: Secondary | ICD-10-CM | POA: Diagnosis not present

## 2024-02-12 DIAGNOSIS — N1831 Chronic kidney disease, stage 3a: Secondary | ICD-10-CM | POA: Diagnosis not present

## 2024-02-12 DIAGNOSIS — I4821 Permanent atrial fibrillation: Secondary | ICD-10-CM | POA: Diagnosis not present

## 2024-02-12 DIAGNOSIS — J449 Chronic obstructive pulmonary disease, unspecified: Secondary | ICD-10-CM | POA: Diagnosis not present

## 2024-02-12 DIAGNOSIS — I5032 Chronic diastolic (congestive) heart failure: Secondary | ICD-10-CM | POA: Diagnosis not present

## 2024-02-12 DIAGNOSIS — J432 Centrilobular emphysema: Secondary | ICD-10-CM | POA: Diagnosis not present

## 2024-02-13 ENCOUNTER — Other Ambulatory Visit: Payer: Self-pay

## 2024-02-13 MED ORDER — FUROSEMIDE 40 MG PO TABS: 40.0000 mg | ORAL_TABLET | Freq: Every day | ORAL | 3 refills | Status: AC

## 2024-03-04 ENCOUNTER — Encounter: Payer: Self-pay | Admitting: Cardiology

## 2024-03-04 ENCOUNTER — Ambulatory Visit: Payer: PPO | Attending: Cardiology | Admitting: Cardiology

## 2024-03-04 VITALS — BP 124/60 | HR 74 | Ht 63.0 in | Wt 158.1 lb

## 2024-03-04 DIAGNOSIS — I10 Essential (primary) hypertension: Secondary | ICD-10-CM | POA: Diagnosis not present

## 2024-03-04 DIAGNOSIS — I5032 Chronic diastolic (congestive) heart failure: Secondary | ICD-10-CM | POA: Diagnosis not present

## 2024-03-04 DIAGNOSIS — I4821 Permanent atrial fibrillation: Secondary | ICD-10-CM | POA: Diagnosis not present

## 2024-03-04 NOTE — Patient Instructions (Signed)
 Medication Instructions:   Your physician recommends that you continue on your current medications as directed. Please refer to the Current Medication list given to you today.   *If you need a refill on your cardiac medications before your next appointment, please call your pharmacy*  Lab Work: No labs ordered today   If you have labs (blood work) drawn today and your tests are completely normal, you will receive your results only by: MyChart Message (if you have MyChart) OR A paper copy in the mail If you have any lab test that is abnormal or we need to change your treatment, we will call you to review the results.  Testing/Procedures:  No test ordered today   Follow-Up: At [facility], you and your health needs are our priority.  As part of our continuing mission to provide you with exceptional heart care, our providers are all part of one team.  This team includes your primary Cardiologist (physician) and Advanced Practice Providers or APPs (Physician Assistants and Nurse Practitioners) who all work together to provide you with the care you need, when you need it.  Your next appointment:   12 month(s)  Provider:   You may see Constancia Delton, MD or one of the following Advanced Practice Providers on your designated Care Team:   Laneta Pintos, NP Gildardo Labrador, PA-C Varney Gentleman, PA-C Cadence [place], PA-C Ronald Cockayne, NP Morey Ar, NP    We recommend signing up for the patient portal called MyChart.  Sign up information is provided on this After Visit Summary.  MyChart is used to connect with patients for Virtual Visits (Telemedicine).  Patients are able to view lab/test results, encounter notes, upcoming appointments, etc.  Non-urgent messages can be sent to your provider as well.   To learn more about what you can do with MyChart, go to [email or web address].

## 2024-03-04 NOTE — Progress Notes (Signed)
 Cardiology Office Note:    Date:  03/04/2024   ID:  Nancy Lopez, DOB 05-28-1941, MRN [id number]  PCP:  Lyle San, MD   [facility] Providers Cardiologist:  Constancia Delton, MD     Referring MD: Lyle San, MD   Chief Complaint  Patient presents with   Follow-up    12 month f/u no complaints today. Meds reviewed verbally with pt.    History of Present Illness:    Nancy Lopez is a 83 y.o. female with a hx of hypertension, HFpEF, permanent A. fib, GERD who presents for follow-up.  Doing okay, denies chest pain, palpitations or shortness of breath.  Compliant with medications as prescribed, denies any bleeding issues with Eliquis .  Denies edema.  States medications seem to be working well.  Prior notes Echocardiogram 01/18/2021 showed normal systolic function, EF 55 to 60%, severely dilated left atrium, mild to moderate MR.  Past Medical History:  Diagnosis Date   Arthritis    right knee   Chest pain    a. 04/2021 MV: EF 62%, no ischemia/infarct. No significant cor Ca2+.   Diastolic dysfunction    a. 01/2021 Echo: EF 55-60%, mild inflat HK, Gr1 DD, nl RV fxn. Mild to mod MR. Mod TR. Mild AI.   Diverticulosis    GERD (gastroesophageal reflux disease)    Hypertension    Osteopenia    Persistent atrial fibrillation (HCC)    a. Dx 01/2021 in the setting of PNA; b. CHA2DS2VASc 4-->eliquis .   Personal history of colonic polyps     Past Surgical History:  Procedure Laterality Date   ABDOMINAL HYSTERECTOMY  1979   BREAST CYST ASPIRATION     CATARACT EXTRACTION     CHOLECYSTECTOMY     COLONOSCOPY WITH PROPOFOL  N/A 09/06/2015   Procedure: COLONOSCOPY WITH PROPOFOL ;  Surgeon: Stephens Eis, MD;  Location: [facility] ENDOSCOPY;  Service: Gastroenterology;  Laterality: N/A;   COLONOSCOPY WITH PROPOFOL  N/A 11/10/2019   Procedure: COLONOSCOPY WITH PROPOFOL ;  Surgeon: Toledo, Alphonsus Jeans, MD;  Location: [facility] ENDOSCOPY;  Service: Gastroenterology;  Laterality: N/A;   EXCISION  METACARPAL MASS Right 08/31/2016   Procedure: EXCISION METACARPAL MASS;  Surgeon: Molli Angelucci, MD;  Location: [facility] ORS;  Service: Orthopedics;  Laterality: Right;   HERNIA REPAIR      Current Medications: Current Meds  Medication Sig   apixaban  (ELIQUIS ) 5 MG TABS tablet Take 1 tablet (5 mg total) by mouth 2 (two) times daily.   ascorbic acid (VITAMIN C) 500 MG tablet Take 500 mg by mouth daily.   Cholecalciferol (D 1000) 25 MCG (1000 UT) capsule Take 1,000 Units by mouth daily.   citalopram (CELEXA) 10 MG tablet Take 10 mg by mouth daily.   diltiazem  (CARDIZEM  CD) 240 MG 24 hr capsule TAKE 1 CAPSULE BY MOUTH EVERY DAY   furosemide  (LASIX ) 40 MG tablet Take 1 tablet (40 mg total) by mouth daily.   metoprolol  succinate (TOPROL -XL) 100 MG 24 hr tablet TAKE 1 TABLET BY MOUTH EVERY DAY   Multiple Vitamins-Minerals (CENTRUM SILVER PO) Take 1 tablet by mouth daily.   TRELEGY ELLIPTA 100-62.5-25 MCG/ACT AEPB Inhale 1 puff into the lungs daily.     Allergies:   Patient has no known allergies.   Social History   Socioeconomic History   Marital status: Widowed    Spouse name: Not on file   Number of children: Not on file   Years of education: Not on file   Highest education level: Not on file  Occupational History   Not on file  Tobacco Use   Smoking status: Never   Smokeless tobacco: Never  Vaping Use   Vaping status: Never Used  Substance and Sexual Activity   Alcohol use: No   Drug use: No   Sexual activity: Never  Other Topics Concern   Not on file  Social History Narrative   Not on file   Social Drivers of Health   Financial Resource Strain: Low Risk  (10/29/2023)   Received from [facility]   Overall Financial Resource Strain (CARDIA)    Difficulty of Paying Living Expenses: Not very hard  Food Insecurity: No Food Insecurity (10/29/2023)   Received from [facility]   Hunger Vital Sign    Within the past 12 months, you worried that  your food would run out before you got the money to buy more.: Never true    Within the past 12 months, the food you bought just didn't last and you didn't have money to get more.: Never true  Transportation Needs: No Transportation Needs (10/29/2023)   Received from [facility] - [facility]    In the past 12 months, has lack of transportation kept you from medical appointments or from getting medications?: No    Lack of Transportation (Non-Medical): No  Physical Activity: Not on file  Stress: Not on file  Social Connections: Not on file     Family History: The patient's family history is negative for Breast cancer.  ROS:   Please see the history of present illness.     All other systems reviewed and are negative.  EKGs/Labs/Other Studies Reviewed:    The following studies were reviewed today:  EKG Interpretation Date/Time:  Tuesday March 04 2024 08:19:09 EDT Ventricular Rate:  74 PR Interval:    QRS Duration:  74 QT Interval:  402 QTC Calculation: 446 R Axis:   -18  Text Interpretation: Atrial fibrillation Nonspecific ST abnormality Confirmed by Constancia Delton ([id number]) on 03/04/2024 8:28:52 AM    Recent Labs: No results found for requested labs within last 365 days.  Recent Lipid Panel No results found for: CHOL, TRIG, HDL, CHOLHDL, VLDL, LDLCALC, LDLDIRECT   Risk Assessment/Calculations:      Physical Exam:    VS:  BP 124/60 (BP Location: Left Arm, Patient Position: Sitting, Cuff Size: Normal)   Pulse 74   Ht 5' 3 (1.6 m)   Wt 158 lb 2 oz (71.7 kg)   SpO2 98%   BMI 28.01 kg/m     Wt Readings from Last 3 Encounters:  03/04/24 158 lb 2 oz (71.7 kg)  03/05/23 158 lb 9.6 oz (71.9 kg)  09/01/22 157 lb 8 oz (71.4 kg)     GEN:  Well nourished, well developed in no acute distress HEENT: Normal NECK: No JVD; No carotid bruits CARDIAC: Irregularly irregular, nontachycardic. RESPIRATORY:  Clear to auscultation  without rales, wheezing or rhonchi  ABDOMEN: Soft, non-tender, non-distended MUSCULOSKELETAL: No deformity, mild nonpitting lymphedema SKIN: Warm and dry NEUROLOGIC:  Alert and oriented x 3 PSYCHIATRIC:  Normal affect   ASSESSMENT:    1. Permanent atrial fibrillation (HCC)   2. Chronic heart failure with preserved ejection fraction (HFpEF) (HCC)   3. Essential hypertension    PLAN:    In order of problems listed above:  Permanent atrial fibrillation, CHA2DS2-VASc score 4 (age, htn, gender).  Heart rate controlled.  Continue Toprol -XL 100 mg daily, Cardizem  CD 240 mg daily.  Eliquis  5 mg twice daily.  HFpEF, appears euvolemic, denies shortness of breath.  Continue Lasix  40 mg daily. Hypertension, BP controlled.  Continue Cardizem , Toprol -XL.  Follow-up in 12 months.   Medication Adjustments/Labs and Tests Ordered: Current medicines are reviewed at length with the patient today.  Concerns regarding medicines are outlined above.  Orders Placed This Encounter  Procedures   EKG 12-Lead   No orders of the defined types were placed in this encounter.   Patient Instructions  Medication Instructions:   Your physician recommends that you continue on your current medications as directed. Please refer to the Current Medication list given to you today.   *If you need a refill on your cardiac medications before your next appointment, please call your pharmacy*  Lab Work:  No labs ordered today   If you have labs (blood work) drawn today and your tests are completely normal, you will receive your results only by: MyChart Message (if you have MyChart) OR A paper copy in the mail If you have any lab test that is abnormal or we need to change your treatment, we will call you to review the results.  Testing/Procedures:  No test ordered today   Follow-Up: At [facility], you and your health needs are our priority.  As part of our continuing mission to provide you with  exceptional heart care, our providers are all part of one team.  This team includes your primary Cardiologist (physician) and Advanced Practice Providers or APPs (Physician Assistants and Nurse Practitioners) who all work together to provide you with the care you need, when you need it.  Your next appointment:   12 month(s)  Provider:   You may see Constancia Delton, MD or one of the following Advanced Practice Providers on your designated Care Team:   Laneta Pintos, NP Gildardo Labrador, PA-C Varney Gentleman, PA-C Cadence Page, PA-C Ronald Cockayne, NP Morey Ar, NP   We recommend signing up for the patient portal called MyChart.  Sign up information is provided on this After Visit Summary.  MyChart is used to connect with patients for Virtual Visits (Telemedicine).  Patients are able to view lab/test results, encounter notes, upcoming appointments, etc.  Non-urgent messages can be sent to your provider as well.   To learn more about what you can do with MyChart, go to [email or web address].          Signed, Constancia Delton, MD  03/04/2024 8:58 AM    [facility]

## 2024-03-10 DIAGNOSIS — J432 Centrilobular emphysema: Secondary | ICD-10-CM | POA: Diagnosis not present

## 2024-03-10 DIAGNOSIS — R0602 Shortness of breath: Secondary | ICD-10-CM | POA: Diagnosis not present

## 2024-03-17 ENCOUNTER — Other Ambulatory Visit: Payer: Self-pay | Admitting: Cardiology

## 2024-03-26 DIAGNOSIS — J441 Chronic obstructive pulmonary disease with (acute) exacerbation: Secondary | ICD-10-CM | POA: Diagnosis not present

## 2024-03-26 DIAGNOSIS — R0609 Other forms of dyspnea: Secondary | ICD-10-CM | POA: Diagnosis not present

## 2024-03-26 DIAGNOSIS — J432 Centrilobular emphysema: Secondary | ICD-10-CM | POA: Diagnosis not present

## 2024-06-26 DIAGNOSIS — J449 Chronic obstructive pulmonary disease, unspecified: Secondary | ICD-10-CM | POA: Diagnosis not present

## 2024-06-26 DIAGNOSIS — Z23 Encounter for immunization: Secondary | ICD-10-CM | POA: Diagnosis not present

## 2024-06-26 DIAGNOSIS — J432 Centrilobular emphysema: Secondary | ICD-10-CM | POA: Diagnosis not present

## 2024-08-10 ENCOUNTER — Other Ambulatory Visit: Payer: Self-pay | Admitting: Physician Assistant
